# Patient Record
Sex: Male | Born: 1950 | Race: White | Hispanic: No | Marital: Married | Smoking: Never smoker
Health system: Southern US, Community
[De-identification: ages and names within clinical notes are randomized; demographics above are authoritative.]

## PROBLEM LIST (undated history)

## (undated) DIAGNOSIS — I499 Cardiac arrhythmia, unspecified: Secondary | ICD-10-CM

## (undated) DIAGNOSIS — C801 Malignant (primary) neoplasm, unspecified: Secondary | ICD-10-CM

## (undated) DIAGNOSIS — E7800 Pure hypercholesterolemia, unspecified: Secondary | ICD-10-CM

## (undated) HISTORY — PX: JOINT REPLACEMENT: SHX530

## (undated) HISTORY — PX: TOTAL KNEE ARTHROPLASTY: SHX125

## (undated) HISTORY — PX: HERNIA REPAIR: SHX51

---

## 2007-06-30 ENCOUNTER — Emergency Department (HOSPITAL_COMMUNITY): Admission: EM | Admit: 2007-06-30 | Discharge: 2007-06-30 | Payer: Self-pay | Admitting: Emergency Medicine

## 2009-10-27 ENCOUNTER — Encounter: Payer: Self-pay | Admitting: Internal Medicine

## 2009-11-17 ENCOUNTER — Ambulatory Visit: Payer: Self-pay | Admitting: Internal Medicine

## 2009-11-17 ENCOUNTER — Ambulatory Visit (HOSPITAL_COMMUNITY): Admission: RE | Admit: 2009-11-17 | Discharge: 2009-11-17 | Payer: Self-pay | Admitting: Internal Medicine

## 2010-08-18 NOTE — Letter (Signed)
Summary: Internal Other Domingo Dimes  Internal Other Domingo Dimes   Imported By: Cloria Spring LPN 95/63/8756 43:32:95  _____________________________________________________________________  External Attachment:    Type:   Image     Comment:   External Document

## 2010-12-01 NOTE — Consult Note (Signed)
Evan Ayala, Evan Ayala                ACCOUNT NO.:  [id number]   MEDICAL RECORD NO.:  [id number]          PATIENT TYPE:  EMS   LOCATION:  [facility]                         FACILITY:  [facility]   PHYSICIAN:  Karol T. Lazarus Salines, M.D. DATE OF BIRTH:  03-14-1951   DATE OF CONSULTATION:  06/30/2007  DATE OF DISCHARGE:  06/30/2007                                 CONSULTATION   CHIEF COMPLAINT:  Epistaxis.   HISTORY:  A 60 year old white male had a fairly profuse left-sided  epistaxis maybe 8 weeks ago.  He does take low-dose aspirin but no other  blood thinners.  That one happened when he leaned over to pick up  something and it took quite some time to stop.  A friend of his who has  some experience on the rescue squad told him to use some Afrin spray,  and eventually the bleeding did stop.  He continued to have some oozing  and went to the emergency room where some sort of a nasal balloon  tamponade pack was placed and then removed in the ENTs office 4-5 days  later.  At that time, he was told that the tissues were sufficiently  excoriated from the pack that he really could not identify an actual  bleeding site, and he was sent back home for further followup as needed.  Today, he had another severe episode of nose bleed, this one essentially  unprovoked and again left-sided.  No pain.  No breathing difficulty.  No  history of trauma to the nose, including surgery.  Again, he is still  taking aspirin but no other blood thinners.  No known bleeding  tendencies.  He is not bleeding inappropriately anywhere else.  He has  not been recently ill.   PAST MEDICAL HISTORY:  Not relevant.   SOCIAL HISTORY:  He is married.  He is retired from work with the [employer] in [place].   FAMILY HISTORY:  Noncontributory.   REVIEW OF SYSTEMS:  Noncontributory.   PHYSICAL EXAMINATION:  GENERAL:  This is a trim, healthy-appearing  middle-aged white male in no distress.  HEENT:  He is not actively  bleeding.  Mental status is sharp.  He hears  well in conversational speech.  Voice is clear and respirations  unlabored through the nose.  The head is atraumatic and neck supple.  Cranial nerves intact.  Both ear canals are clear with normal aerated  drums.  Anterior nose shows a mild leftward septal deviation with what  appears to be a granuloma at the chondroethmoidal junction approximately  3 cm back on the left side.  The right side shows some exposed vessels  on Kiesselbach's plexus just behind the mucocutaneous junction but  without evidence of bleeding.  No polyps or active drainage.  Oral  cavity is clear with teeth in good repair and moist membranes.  Oropharynx clear with no active bleeding.  I did not examine nasopharynx  or hypopharynx.  NECK:  Without adenopathy.   IMPRESSION:  Left anterior epistaxis, probably secondary to granuloma.   PLAN:  I discussed this with him.  With informed consent, I anesthetized  the left side of the nose using 4% cocaine solution on cotton pledgets.  After allowing several minutes for these to take effect, silver nitrate  cautery was performed of the granuloma, with some bleeding induced but  with good tolerance and good control.  Hemostasis was achieved.   I gave the patient instructions regarding nasal hygiene measures  including twice daily ointment and frequent saline spray.  I will see  him back on an as-needed basis.  He should limit his activity for a few  days but then is welcome to resume all normal activities.  He should  continue his low-dose aspirin for the appropriate medical indications.  He understands and agrees with the discussion and plans.      Gloris [place]. Lazarus Salines, M.D.  Electronically Signed     KTW/MEDQ  D:  06/30/2007  T:  07/02/2007  Job:  [id number]

## 2014-08-19 DIAGNOSIS — R001 Bradycardia, unspecified: Secondary | ICD-10-CM | POA: Insufficient documentation

## 2014-08-19 DIAGNOSIS — I10 Essential (primary) hypertension: Secondary | ICD-10-CM | POA: Insufficient documentation

## 2014-08-19 DIAGNOSIS — E785 Hyperlipidemia, unspecified: Secondary | ICD-10-CM | POA: Insufficient documentation

## 2014-09-18 ENCOUNTER — Ambulatory Visit (HOSPITAL_COMMUNITY): Payer: Managed Care, Other (non HMO) | Attending: Orthopaedic Surgery | Admitting: Physical Therapy

## 2014-09-18 DIAGNOSIS — M25562 Pain in left knee: Secondary | ICD-10-CM

## 2014-09-18 DIAGNOSIS — R29898 Other symptoms and signs involving the musculoskeletal system: Secondary | ICD-10-CM

## 2014-09-18 DIAGNOSIS — M25662 Stiffness of left knee, not elsewhere classified: Secondary | ICD-10-CM | POA: Insufficient documentation

## 2014-09-18 DIAGNOSIS — Z471 Aftercare following joint replacement surgery: Secondary | ICD-10-CM | POA: Insufficient documentation

## 2014-09-18 DIAGNOSIS — Z96652 Presence of left artificial knee joint: Secondary | ICD-10-CM | POA: Diagnosis not present

## 2014-09-18 DIAGNOSIS — R262 Difficulty in walking, not elsewhere classified: Secondary | ICD-10-CM | POA: Insufficient documentation

## 2014-09-18 DIAGNOSIS — M6281 Muscle weakness (generalized): Secondary | ICD-10-CM | POA: Diagnosis not present

## 2014-09-18 NOTE — Therapy (Signed)
[place], [place], [address] Phone: [phone]   Fax:  [phone]  Physical Therapy Evaluation  Patient Details  Name: Evan Ayala MRN: [id number] Date of Birth: 11/19/1950 Referring Provider:  Myra Rude, MD  Encounter Date: 09/18/2014      PT End of Session - 09/18/14 1046    Visit Number 1   Number of Visits 16   Date for PT Re-Evaluation 10/18/14   Authorization Type [employer]ign[employer]   Authorization - Visit Number 1   Authorization - Number of Visits 16   PT Start Time 1017   PT Stop Time 1100   PT Time Calculation (min) 43 min   Activity Tolerance Patient tolerated treatment well   Behavior During Therapy [facility] for tasks assessed/performed      No past medical history on file.  No past surgical history on file.  There were no vitals taken for this visit.  Visit Diagnosis:  Status post total left knee replacement  Difficulty walking  Left knee pain  Knee stiffness, left  Weakness of left lower extremity      Subjective Assessment - 09/18/14 1023    Symptoms Notes cramoping in Lt calf.    Pertinent History Lt knee TKA, patient had home health physical therapy 6x. no longer taking pain medications.    How long can you walk comfortably? 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 Yes   Pain Score 7   at worst, typically 1-2   Pain Location Knee   Pain Orientation Left   Pain Descriptors / Indicators Aching   Pain Type Surgical pain   Pain Onset 1 to 4 weeks ago   Pain Frequency Intermittent   Aggravating Factors  too much activity.    Pain Relieving Factors ice.           [facility] PT Assessment - 09/18/14 0001    Assessment   Medical Diagnosis Lt TKA resulting ion diffiulty walking.    Onset Date 08/27/14   Next MD Visit Rhett Hallows, 10/23/14   Prior Therapy 6x with HHPT.    Balance Screen   Has  the patient fallen in the past 6 months No   Has the patient had a decrease in activity level because of a fear of falling?  No   Is the patient reluctant to leave their home because of a fear of falling?  No   Prior Function   Level of Independence Independent with basic ADLs   Cognition   Overall Cognitive Status Within Functional Limits for tasks assessed   Functional Tests   Functional tests Other;Other2   Other:   Other/ Comments Gait: limited hipp internal and External rotation, limited stride length secodnary to limited knee extension.    ROM / Strength   AROM / PROM / Strength AROM;Strength   AROM   AROM Assessment Site Hip;Knee;Ankle   Right/Left Hip Right;Left   Right/Left Knee Left;Right   Right Knee Extension 0   Right Knee Flexion 121   Left Knee Extension -6   Left Knee Flexion 92   Right/Left Ankle Right;Left   Strength   Strength Assessment Site Hip;Knee;Ankle   Right/Left Hip Right;Left   Right Hip Flexion 5/5   Left Hip Flexion 4/5   Right/Left Knee Right;Left   Right Knee Flexion 5/5   Right Knee Extension 5/5   Left Knee Flexion 2+/5  Left Knee Extension 2+/5   Right/Left Ankle Right;Left   Right Ankle Dorsiflexion 5/5   Left Ankle Dorsiflexion 4+/5            PT Education - 09/18/14 1043    Education provided Yes   Education Details LE stretches: Calf, hamstring, hip flexor groin stretch 3 ways each with 20 ssecond holds for HEP.   Person(s) Educated Patient   Methods Explanation;Demonstration;Handout   Comprehension Verbalized understanding;Returned demonstration          PT Short Term Goals - 09/18/14 1903    PT SHORT TERM GOAL #1   Title Patien twiill be independent with HEP   Time 4   Period Weeks   Status New   PT SHORT TERM GOAL #2   Title Patint will be able to dmeosntrate full Lt knee extenion to 0 degrees   Baseline -6 degrees   Time 4   Period Weeks   Status New   PT SHORT TERM GOAL #3   Title Patient will be able to  dmeosntrate Lt knee flexion of 110 degrees to be able to squat to a low chair   Baseline 92 degrees   Time 4   Period Weeks   Status New           PT Long Term Goals - 09/18/14 1905    PT LONG TERM GOAL #1   Title Patient will state 0 pain with ambualtion >29minutes   Time 8   Period Weeks   Status New   PT LONG TERM GOAL #2   Title Patient will be bale to flex Lt knee 120 degrees to be able to croutch to work in garden.    Time 8   Period Weeks   Status New   PT LONG TERM GOAL #3   Title Patient will demonstrate 5/5 MMT strength for quadriceps and hamstrings to be able to go up and down stairs without UE support.    Time 8   Period Weeks   Status New               Plan - 09/18/14 1047    Clinical Impression Statement Patient displasy Lt knee stiffness and pain resulting in difficulty walking s/p Lt TKA. 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    Pt will benefit from skilled therapeutic intervention in order to improve on the following deficits Abnormal gait;Decreased endurance;Increased muscle spasms;Improper body mechanics;Impaired flexibility;Decreased strength;Difficulty walking;Pain;Decreased range of motion   Rehab Potential Good   PT Frequency 2x / week   PT Duration 8 weeks   PT Treatment/Interventions Gait training;Stair training;Patient/family education;Manual techniques;Therapeutic exercise;Balance training   PT Next Visit Plan Introduce pirifomris stretch and 3D hip excursions. increase box height for stretches to 14inches. intial focus on increasing knee extension.    PT Home Exercise Plan LE stretches hand out. given   Consulted and Agree with Plan of Care Patient         Problem List There are no active problems to display for this patient.   Leia Alf 09/18/2014, 7:06 PM  [place], [place], [address] Phone: [phone]   Fax:  [phone]

## 2014-09-18 NOTE — Addendum Note (Signed)
Addended by: Devona Konig R on: 09/18/2014 07:08 PM   Modules accepted: Orders

## 2014-09-20 ENCOUNTER — Ambulatory Visit (HOSPITAL_COMMUNITY): Payer: Managed Care, Other (non HMO)

## 2014-09-20 ENCOUNTER — Telehealth (HOSPITAL_COMMUNITY): Payer: Self-pay

## 2014-09-20 DIAGNOSIS — Z471 Aftercare following joint replacement surgery: Secondary | ICD-10-CM | POA: Diagnosis not present

## 2014-09-20 DIAGNOSIS — R262 Difficulty in walking, not elsewhere classified: Secondary | ICD-10-CM

## 2014-09-20 DIAGNOSIS — R29898 Other symptoms and signs involving the musculoskeletal system: Secondary | ICD-10-CM

## 2014-09-20 DIAGNOSIS — M25662 Stiffness of left knee, not elsewhere classified: Secondary | ICD-10-CM

## 2014-09-20 DIAGNOSIS — M25562 Pain in left knee: Secondary | ICD-10-CM

## 2014-09-20 DIAGNOSIS — Z96652 Presence of left artificial knee joint: Secondary | ICD-10-CM

## 2014-09-20 NOTE — Therapy (Signed)
[place], [place], [address] Phone: [phone]   Fax:  [phone]  Physical Therapy Treatment  Patient Details  Name: Evan Ayala MRN: [id number] Date of Birth: 12/07/1950 Referring Provider:  Myra Rude, MD  Encounter Date: 09/20/2014      PT End of Session - 09/20/14 1659    Visit Number 2   Number of Visits 16   Date for PT Re-Evaluation 10/18/14   Authorization Type [employer]ign[employer]   Authorization - Visit Number 2   Authorization - Number of Visits 16   PT Start Time 6962   PT Stop Time 1738   PT Time Calculation (min) 56 min   Activity Tolerance Patient tolerated treatment well   Behavior During Therapy [facility] for tasks assessed/performed      No past medical history on file.  No past surgical history on file.  There were no vitals taken for this visit.  Visit Diagnosis:  Status post total left knee replacement  Difficulty walking  Left knee pain  Knee stiffness, left  Weakness of left lower extremity      Subjective Assessment - 09/20/14 1642    Symptoms Pt reports minimal pain scale today 1/10 on medial aspect of knee.  Reports walking 1/2 every day and compliant with HEP daily without question.   Currently in Pain? Yes   Pain Score 1    Pain Location Knee   Pain Orientation Left;Medial   Pain Descriptors / Indicators Sharp   Pain Type Surgical pain          [facility] PT Assessment - 09/20/14 0001    Assessment   Medical Diagnosis Lt TKA resulting ion diffiulty walking.    Onset Date 08/27/14   Next MD Visit Rhett Hallows, 10/18/14   Prior Therapy 6x with HHPT.                   [facility] Adult PT Treatment/Exercise - 09/20/14 0001    Exercises   Exercises Knee/Hip   Knee/Hip Exercises: Stretches   Active Hamstring Stretch 3 reps;30 seconds   Active Hamstring Stretch Limitations 14 in step 3 directions   Quad Stretch 3 reps;30 seconds   Quad Stretch Limitations prone with  rope   Hip Flexor Stretch 3 reps;30 seconds   Hip Flexor Stretch Limitations 14in box   Piriformis Stretch 3 reps;30 seconds   Piriformis Stretch Limitations supine figure 4 with towel   Gastroc Stretch 3 reps;30 seconds   Gastroc Stretch Limitations slant board   Knee/Hip Exercises: Aerobic   Stationary Bike 8' seat 14   Knee/Hip Exercises: Standing   Functional Squat 10 reps;2 sets   Functional Squat Limitations neutral for frontal and transverse planes; split stance Lt LE behind sagital plane with    Gait Training Gait training with focus on heel to toe pattern, equal stance phase and stride length 226 feet   Knee/Hip Exercises: Supine   Quad Sets AAROM;10 reps   Short Arc [employer] Left;15 reps   Short Arc Quad Sets Limitations 5" holds   Heel Slides 10 reps   Terminal Knee Extension AAROM;Left;10 reps   Straight Leg Raises 10 reps   Knee Extension PROM;Left;3 sets                PT Education - 09/20/14 1743    Education provided Yes   Education Details Reviewed HEP, explained benefits of ice and appropriate time for iceing, gait mechanics  Person(s) Educated Patient   Methods Explanation;Demonstration   Comprehension Verbalized understanding;Returned demonstration          PT Short Term Goals - 09/20/14 1700    PT SHORT TERM GOAL #1   Title Patien twiill be independent with HEP   Status On-going   PT SHORT TERM GOAL #2   Title Patint will be able to dmeosntrate full Lt knee extenion to 0 degrees   Status On-going   PT SHORT TERM GOAL #3   Title Patient will be able to dmeosntrate Lt knee flexion of 110 degrees to be able to squat to a low chair   Status On-going           PT Long Term Goals - 09/20/14 1700    PT LONG TERM GOAL #1   Title Patient will state 0 pain with ambualtion >88minutes   PT LONG TERM GOAL #2   Title Patient will be bale to flex Lt knee 120 degrees to be able to croutch to work in garden.    PT LONG TERM GOAL #3   Title  Patient will demonstrate 5/5 MMT strength for quadriceps and hamstrings to be able to go up and down stairs without UE support.                Plan - 09/20/14 1726    Clinical Impression Statement Reviewed HEP stretches for correct form to assure correct techniques at home with min cueing for foot placement with hamstring stretches.  Session focus on impproving AROM primarly extension.  Neruomusculature re-ed to improve quad contraction to assist with knee extension with tactile and verbal cueing to relax glut activaiton  Good activation with SAQ.  PROM complete to improve ROM per pt. tolerance.  AROM lacking 6-95 degrees this session.  Pt stated he has recumbent bike at home, able to complete full revolution seat 14.  Pain sacle 1/10 at end of sessoin.  Pt encouraged to apply ice for 10-20 minutes at home for pain control.     PT Next Visit Plan Continue with current PT POC including stretches, improve AROM wtih focus on knee extension, gait training and 3D hip excursion with focus on equal stance phase.        Problem List There are no active problems to display for this patient.  Ihor Austin, [place]  Aldona Lento 09/20/2014, 5:47 PM  [place], [place], [address] Phone: [phone]   Fax:  [phone]

## 2014-09-26 ENCOUNTER — Ambulatory Visit (HOSPITAL_COMMUNITY): Payer: Managed Care, Other (non HMO) | Admitting: Physical Therapy

## 2014-09-26 DIAGNOSIS — Z471 Aftercare following joint replacement surgery: Secondary | ICD-10-CM | POA: Diagnosis not present

## 2014-09-26 DIAGNOSIS — M25662 Stiffness of left knee, not elsewhere classified: Secondary | ICD-10-CM

## 2014-09-26 DIAGNOSIS — R29898 Other symptoms and signs involving the musculoskeletal system: Secondary | ICD-10-CM

## 2014-09-26 DIAGNOSIS — R262 Difficulty in walking, not elsewhere classified: Secondary | ICD-10-CM

## 2014-09-26 DIAGNOSIS — Z96652 Presence of left artificial knee joint: Secondary | ICD-10-CM

## 2014-09-26 DIAGNOSIS — M25562 Pain in left knee: Secondary | ICD-10-CM

## 2014-09-26 NOTE — Therapy (Signed)
[place], [place], [address] Phone: [phone]   Fax:  [phone]  Physical Therapy Treatment  Patient Details  Name: Evan Ayala MRN: [id number] Date of Birth: 1951/06/01 Referring Provider:  Myra Rude, MD  Encounter Date: 09/26/2014      PT End of Session - 09/26/14 1807    Visit Number 3   Number of Visits 16   Date for PT Re-Evaluation 10/18/14   Authorization Type Cigna   Authorization - Visit Number 3   Authorization - Number of Visits 16   PT Start Time 0071   PT Stop Time 1817   PT Time Calculation (min) 41 min   Activity Tolerance Patient tolerated treatment well   Behavior During Therapy [facility] for tasks assessed/performed      No past medical history on file.  No past surgical history on file.  There were no vitals filed for this visit.  Visit Diagnosis:  Status post total left knee replacement  Difficulty walking  Left knee pain  Knee stiffness, left  Weakness of left lower extremity      Subjective Assessment - 09/26/14 1735    Symptoms Patient is able to go up and down stairs with reciprocal pattern and notes improved pain except when sleeping.    Currently in Pain? Yes   Pain Score 1    Pain Location Knee   Pain Orientation Left;Medial   Pain Descriptors / Indicators Sharp   Pain Type Surgical pain            [facility] Adult PT Treatment/Exercise - 09/26/14 0001    Knee/Hip Exercises: Stretches   Active Hamstring Stretch 2 reps;20 seconds   Active Hamstring Stretch Limitations 14 in step 3 directions   Quad Stretch 3 reps;30 seconds   Quad Stretch Limitations prone with rope   Hip Flexor Stretch 3 reps;30 seconds   Hip Flexor Stretch Limitations 14in box   ITB Stretch Limitations 10x 3" to 4" box   Piriformis Stretch 3 reps;30 seconds   Piriformis Stretch Limitations supine figure 4 with towel   Gastroc Stretch 3 reps;20 seconds   Gastroc Stretch Limitations  slant board   Knee/Hip Exercises: Aerobic   Stationary Bike 8' seat 14   Knee/Hip Exercises: Standing   Forward Lunges Limitations 3 way knee drives on 12" box 21F    Functional Squat Limitations 3D hip excursions 10x   Gait Training walking hip excursions              PT Short Term Goals - 09/20/14 1700    PT SHORT TERM GOAL #1   Title Patien twiill be independent with HEP   Status On-going   PT SHORT TERM GOAL #2   Title Patint will be able to dmeosntrate full Lt knee extenion to 0 degrees   Status On-going   PT SHORT TERM GOAL #3   Title Patient will be able to dmeosntrate Lt knee flexion of 110 degrees to be able to squat to a low chair   Status On-going           PT Long Term Goals - 09/20/14 1700    PT LONG TERM GOAL #1   Title Patient will state 0 pain with ambualtion >92minutes   PT LONG TERM GOAL #2   Title Patient will be bale to flex Lt knee 120 degrees to be able to croutch to work in garden.    PT LONG TERM GOAL #3  Title Patient will demonstrate 5/5 MMT strength for quadriceps and hamstrings to be able to go up and down stairs without UE support.            Plan - 09/26/14 1808    Clinical Impression Statement Stretches reviewed with good performance. Patient demonstrates improving knee AROM with improving gait. Functional manual reactions performed during calf and hip flexor stretches to increase knee extension with knee nearly obtaining full extension. patient demonstrated improving gait with more functional knee extension at end of session.    PT Next Visit Plan Continue with current POC including stretches, improve AROM wtih focus on knee extension, gait training and 3D hip excursion with focus on equal stance phase.        Problem List There are no active problems to display for this patient.  Devona Konig PT DPT [place] [address] [place], [place], [address] Phone:  [phone]   Fax:  [phone]

## 2014-09-27 ENCOUNTER — Ambulatory Visit (HOSPITAL_COMMUNITY): Payer: Managed Care, Other (non HMO)

## 2014-09-27 DIAGNOSIS — Z471 Aftercare following joint replacement surgery: Secondary | ICD-10-CM | POA: Diagnosis not present

## 2014-09-27 DIAGNOSIS — M25562 Pain in left knee: Secondary | ICD-10-CM

## 2014-09-27 DIAGNOSIS — R29898 Other symptoms and signs involving the musculoskeletal system: Secondary | ICD-10-CM

## 2014-09-27 DIAGNOSIS — Z96652 Presence of left artificial knee joint: Secondary | ICD-10-CM

## 2014-09-27 DIAGNOSIS — R262 Difficulty in walking, not elsewhere classified: Secondary | ICD-10-CM

## 2014-09-27 DIAGNOSIS — M25662 Stiffness of left knee, not elsewhere classified: Secondary | ICD-10-CM

## 2014-09-27 NOTE — Therapy (Addendum)
[place], [place], [address] Phone: [phone]   Fax:  [phone]  Physical Therapy Treatment  Patient Details  Name: Evan Ayala MRN: [id number] Date of Birth: 11-Jul-1951 Referring Provider:  Myra Rude, MD  Encounter Date: 09/27/2014      PT End of Session - 09/27/14 1651    Visit Number 4   Number of Visits 16   Date for PT Re-Evaluation 10/18/14   Authorization Type Cigna   Authorization - Visit Number 4   Authorization - Number of Visits 16   PT Start Time 2423   PT Stop Time 1834   PT Time Calculation (min) 49 min   Activity Tolerance Patient tolerated treatment well   Behavior During Therapy [facility] for tasks assessed/performed      No past medical history on file.  No past surgical history on file.  There were no vitals filed for this visit.  Visit Diagnosis:  Status post total left knee replacement  Difficulty walking  Left knee pain  Knee stiffness, left  Weakness of left lower extremity      Subjective Assessment - 09/27/14 1646    Symptoms Pt stated he was sore followng last session, pain minimal today 1/10   Currently in Pain? Yes   Pain Score 1    Pain Location Knee   Pain Orientation Left;Medial   Pain Descriptors / Indicators [facility] PT Assessment - 09/27/14 0001    Assessment   Medical Diagnosis Lt TKA resulting ion diffiulty walking.    Onset Date 08/27/14   Next MD Visit Rhett Hallows, 10/18/14   Prior Therapy 6x with HHPT.                    [facility] Adult PT Treatment/Exercise - 09/27/14 1722    Exercises   Exercises Knee/Hip   Knee/Hip Exercises: Stretches   Active Hamstring Stretch 3 reps;30 seconds   Active Hamstring Stretch Limitations 14 in step 3 directions   Quad Stretch 3 reps;30 seconds   Quad Stretch Limitations prone with rope   Knee: Self-Stretch Limitations knee drives on 53IR step   Piriformis Stretch 3 reps;30  seconds   Piriformis Stretch Limitations seated   Gastroc Stretch 3 reps;20 seconds   Gastroc Stretch Limitations slant board   Knee/Hip Exercises: Aerobic   Stationary Bike 8' seat 12   Knee/Hip Exercises: Standing   Functional Squat Limitations 3D hip excursions 10x split stance   Gait Training walking hip excursions    Knee/Hip Exercises: Supine   Short Arc Quad Sets Left;15 reps   Heel Slides 10 reps   Heel Slides Limitations 5-112                  PT Short Term Goals - 09/27/14 1715    PT SHORT TERM GOAL #1   Title Patien twiill be independent with HEP   Baseline Daily   Status Achieved   PT SHORT TERM GOAL #2   Title Patint will be able to dmeosntrate full Lt knee extenion to 0 degrees   Baseline 09/27/2014 lacking 5 degress   PT SHORT TERM GOAL #3   Title Patient will be able to dmeosntrate Lt knee flexion of 110 degrees to be able to squat to a low chair   Baseline 09/27/2014 112 degrees   Status Partially Met  PT Long Term Goals - 09/27/14 1717    PT LONG TERM GOAL #1   Title Patient will state 0 pain with ambualtion >80mnutes   PT LONG TERM GOAL #2   Title Patient will be able to flex Lt knee 120 degrees to be able to croutch to work in garden.    PT LONG TERM GOAL #3   Title Patient will demonstrate 5/5 MMT strength for quadriceps and hamstrings to be able to go up and down stairs without UE support.                Plan - 09/27/14 1847    Clinical Impression Statement ROM making vast improvements, AROM 5-112 this session.  3D hip excursion complete in split stance this session to improve weight bearing for equalized stance phase.  Hip mobiltiy is improving, pt able to complete piriformis stretch in seated position this session.  No reports of pain through session.   PT Next Visit Plan Continue with current POC including stretches, improve AROM wtih focus on knee extension, gait training and 3D hip excursion with focus on equal stance  phase.        Problem List There are no active problems to display for this patient.  CIhor Austin [place] CAldona Lento3/05/2015, 6:52 PM  [id number] [address]Phone: [phone]  Fax:  [phone]

## 2014-10-01 ENCOUNTER — Ambulatory Visit (HOSPITAL_COMMUNITY): Payer: Managed Care, Other (non HMO)

## 2014-10-01 DIAGNOSIS — Z471 Aftercare following joint replacement surgery: Secondary | ICD-10-CM | POA: Diagnosis not present

## 2014-10-01 DIAGNOSIS — Z96652 Presence of left artificial knee joint: Secondary | ICD-10-CM

## 2014-10-01 DIAGNOSIS — M25562 Pain in left knee: Secondary | ICD-10-CM

## 2014-10-01 DIAGNOSIS — R262 Difficulty in walking, not elsewhere classified: Secondary | ICD-10-CM

## 2014-10-01 DIAGNOSIS — M25662 Stiffness of left knee, not elsewhere classified: Secondary | ICD-10-CM

## 2014-10-01 DIAGNOSIS — R29898 Other symptoms and signs involving the musculoskeletal system: Secondary | ICD-10-CM

## 2014-10-01 NOTE — Therapy (Signed)
[place], [place], [address] Phone: [phone]   Fax:  [phone]  Physical Therapy Treatment  Patient Details  Name: Evan Ayala MRN: [id number] Date of Birth: 1950-12-05 Referring Provider:  Myra Rude, MD  Encounter Date: 10/01/2014      PT End of Session - 10/01/14 1719    Visit Number 5   Number of Visits 16   Date for PT Re-Evaluation 10/18/14   Authorization Type [employer]   Authorization - Visit Number 5   Authorization - Number of Visits 16   PT Start Time 6962   PT Stop Time 1733   PT Time Calculation (min) 45 min   Activity Tolerance Patient tolerated treatment well   Behavior During Therapy [facility] for tasks assessed/performed      No past medical history on file.  No past surgical history on file.  There were no vitals filed for this visit.  Visit Diagnosis:  Status post total left knee replacement  Difficulty walking  Left knee pain  Knee stiffness, left  Weakness of left lower extremity      Subjective Assessment - 10/01/14 1706    Symptoms Pt stated he did too much at [facility] this weekend, pain scale 1-2/10   Currently in Pain? Yes   Pain Score 2    Pain Location Knee   Pain Orientation Left;Medial;Lateral   Pain Descriptors / Indicators Dull           [facility] Adult PT Treatment/Exercise - 10/01/14 1717    Exercises   Exercises Knee/Hip   Knee/Hip Exercises: Stretches   Active Hamstring Stretch 3 reps;30 seconds   Active Hamstring Stretch Limitations 14 in step 3 directions   Quad Stretch 3 reps;30 seconds   Quad Stretch Limitations prone with rope   Knee: Self-Stretch Limitations knee drives on 6in step   Piriformis Stretch 3 reps;30 seconds   Piriformis Stretch Limitations seated   Gastroc Stretch 3 reps;30 seconds   Gastroc Stretch Limitations slant board   Knee/Hip Exercises: Aerobic   Stationary Bike 8' seat 12-- 10   Knee/Hip Exercises: Standing    Functional Squat Limitations 3D hip excursions 10x split stance   Rocker Board 2 minutes   Rocker Board Limitations R/L   Gait Training walking hip excursions    Knee/Hip Exercises: Supine   Short Arc Quad Sets Left;15 reps   Heel Slides Limitations 4-113   Terminal Knee Extension AROM;Left;10 reps   Terminal Knee Extension Limitations Therapist facilitation for proper mm activatin   Knee/Hip Exercises: Prone   Other Prone Exercises TKE 10 x5"             PT Short Term Goals - 10/01/14 1721    PT SHORT TERM GOAL #1   Title Patien twiill be independent with HEP   Status Achieved   PT SHORT TERM GOAL #2   Title Patint will be able to dmeosntrate full Lt knee extenion to 0 degrees   Status On-going   PT SHORT TERM GOAL #3   Title Patient will be able to dmeosntrate Lt knee flexion of 110 degrees to be able to squat to a low chair           PT Long Term Goals - 10/01/14 1721    PT LONG TERM GOAL #1   Title Patient will state 0 pain with ambualtion >71minutes   PT LONG TERM GOAL #2   Title Patient will be able to  flex Lt knee 120 degrees to be able to croutch to work in garden.    PT LONG TERM GOAL #3   Title Patient will demonstrate 5/5 MMT strength for quadriceps and hamstrings to be able to go up and down stairs without UE support.                Plan - 10/01/14 1721    Clinical Impression Statement AROM is progressing with improved extension and flexion this session, AROM 4-113 degrees.  Added TKE to POC to improve quad activation for knee extension.  Reviewed proper gait mechanics with emphasis on knee extension with heel touch.with limping resolved following cues.  Pt reported pain reduced at end of session.   PT Next Visit Plan Continue with current POC including stretches, improve AROM wtih focus on knee extension, gait training and 3D hip excursion with focus on equal stance phase.        Problem List There are no active problems to display for this  patient.  Ihor Austin, [place]  Aldona Lento 10/01/2014, 5:33 PM  [place] [address] [place], [place], [address] Phone: [phone]   Fax:  [phone]

## 2014-10-03 ENCOUNTER — Ambulatory Visit (HOSPITAL_COMMUNITY): Payer: Managed Care, Other (non HMO)

## 2014-10-03 DIAGNOSIS — R29898 Other symptoms and signs involving the musculoskeletal system: Secondary | ICD-10-CM

## 2014-10-03 DIAGNOSIS — R262 Difficulty in walking, not elsewhere classified: Secondary | ICD-10-CM

## 2014-10-03 DIAGNOSIS — Z96652 Presence of left artificial knee joint: Secondary | ICD-10-CM

## 2014-10-03 DIAGNOSIS — M25662 Stiffness of left knee, not elsewhere classified: Secondary | ICD-10-CM

## 2014-10-03 DIAGNOSIS — M25562 Pain in left knee: Secondary | ICD-10-CM

## 2014-10-03 DIAGNOSIS — Z471 Aftercare following joint replacement surgery: Secondary | ICD-10-CM | POA: Diagnosis not present

## 2014-10-03 NOTE — Therapy (Signed)
[place], [place], [address] Phone: [phone]   Fax:  [phone]  Physical Therapy Treatment  Patient Details  Name: Evan Ayala MRN: [id number] Date of Birth: Jun 05, 1951 Referring Provider:  Myra Rude, MD  Encounter Date: 10/03/2014      PT End of Session - 10/03/14 0807    Visit Number 6   Number of Visits 16   Date for PT Re-Evaluation 10/18/14   Authorization Type [employer]   Authorization - Visit Number 6   Authorization - Number of Visits 10   PT Start Time 0800   PT Stop Time 0846   PT Time Calculation (min) 46 min   Activity Tolerance Patient tolerated treatment well   Behavior During Therapy [facility] for tasks assessed/performed      No past medical history on file.  No past surgical history on file.  There were no vitals filed for this visit.  Visit Diagnosis:  Status post total left knee replacement  Difficulty walking  Left knee pain  Knee stiffness, left  Weakness of left lower extremity      Subjective Assessment - 10/03/14 0803    Symptoms Pt stated Lt knee is sore with minimal pain scale 1/10 on medial portion.  Pt stated he ran out of anti-inflammatory meds last week, took aleve this morining prior therapy.   Currently in Pain? Yes   Pain Score 1    Pain Location Knee   Pain Orientation Left;Medial   Pain Descriptors / Indicators Aching            [facility] PT Assessment - 10/03/14 0001    Assessment   Medical Diagnosis Lt TKA resulting ion diffiulty walking.    Onset Date 08/27/14   Next MD Visit Rhett Hallows, 10/18/14   Prior Therapy 6x with HHPT.    AROM   Left Knee Extension -4   Left Knee Flexion 113                   [facility] Adult PT Treatment/Exercise - 10/03/14 0811    Exercises   Exercises Knee/Hip   Knee/Hip Exercises: Stretches   Active Hamstring Stretch 3 reps;30 seconds   Active Hamstring Stretch Limitations 14 in step 3 directions   Quad Stretch 3 reps;30 seconds   Quad Stretch Limitations prone with rope   Gastroc Stretch 3 reps;30 seconds   Gastroc Stretch Limitations slant board   Knee/Hip Exercises: Aerobic   Stationary Bike 8' seat 11-- 10 for ROM   Knee/Hip Exercises: Standing   Terminal Knee Extension Left;10 reps;Theraband   Theraband Level (Terminal Knee Extension) Level 3 (Green)   Lateral Step Up Left;15 reps;Hand Hold: 1;Step Height: 4"   Functional Squat Limitations 3D hip excursions 10x split stance   Rocker Board 2 minutes   Rocker Board Limitations R/L   SLS L 33", Rt 9" max of 3   Gait Training walking hip excursions    Other Standing Knee Exercises 10 STS no HHA from 22in chair   Knee/Hip Exercises: Supine   Short Arc Quad Sets Left;15 reps   Heel Slides Limitations 4-113   Terminal Knee Extension AROM;Left;15 reps   Knee/Hip Exercises: Prone   Other Prone Exercises TKE 10 x5"                  PT Short Term Goals - 10/03/14 0807    PT SHORT TERM GOAL #1   Title Patien twiill be independent  with HEP   Status Achieved   PT SHORT TERM GOAL #2   Title Patint will be able to dmeosntrate full Lt knee extenion to 0 degrees   Baseline 10/03/2014 lacking 4 degress   Status On-going   PT SHORT TERM GOAL #3   Title Patient will be able to dmeosntrate Lt knee flexion of 110 degrees to be able to squat to a low chair   Baseline 10/03/2014 113 degrees with ability to complete STS no HHA from 22in chair   Status Partially Met           PT Long Term Goals - 10/03/14 7048    PT LONG TERM GOAL #1   Title Patient will state 0 pain with ambualtion >68mnutes   PT LONG TERM GOAL #2   Title Patient will be able to flex Lt knee 120 degrees to be able to croutch to work in garden.    PT LONG TERM GOAL #3   Title Patient will demonstrate 5/5 MMT strength for quadriceps and hamstrings to be able to go up and down stairs without UE support.    Status On-going               Plan -  10/03/14 08891   Clinical Impression Statement Noted increased edema this session limiing AROM especially extenson, pt stated he ran out of anti-inflammation meds last week, pt encoiuraged to call MD about possible refill.  Added stanidng TKE to improve quad strengthening for knee extension as well as lateral step up for functioanl strengtheing and SLS for stabilty.  Pt able to complete 10 STS from 22 in chair with split stance with no HHA.  No reports of increased pain through session    PT Next Visit Plan Continue with current POC including stretches, improve AROM wtih focus on knee extension, gait training and 3D hip excursion with focus on equal stance phase.        Problem List There are no active problems to display for this patient. CIhor Austin LStow  CAldona Lento3/17/2016, 8:57 AM  [place] [address]Phone: [phone]  Fax:  [phone]

## 2014-10-08 ENCOUNTER — Ambulatory Visit (HOSPITAL_COMMUNITY): Payer: Managed Care, Other (non HMO) | Admitting: Physical Therapy

## 2014-10-08 DIAGNOSIS — M25662 Stiffness of left knee, not elsewhere classified: Secondary | ICD-10-CM

## 2014-10-08 DIAGNOSIS — Z96652 Presence of left artificial knee joint: Secondary | ICD-10-CM

## 2014-10-08 DIAGNOSIS — R262 Difficulty in walking, not elsewhere classified: Secondary | ICD-10-CM

## 2014-10-08 DIAGNOSIS — M25562 Pain in left knee: Secondary | ICD-10-CM

## 2014-10-08 DIAGNOSIS — Z471 Aftercare following joint replacement surgery: Secondary | ICD-10-CM | POA: Diagnosis not present

## 2014-10-08 DIAGNOSIS — R29898 Other symptoms and signs involving the musculoskeletal system: Secondary | ICD-10-CM

## 2014-10-08 NOTE — Therapy (Addendum)
[place], [place], [address] Phone: [phone]   Fax:  [phone]  Physical Therapy Treatment  Patient Details  Name: Evan Ayala MRN: [id number] Date of Birth: [birth year] Referring Provider:  Myra Rude, MD  Encounter Date: 10/08/2014      PT End of Session - 10/08/14 0816    Visit Number 7   Number of Visits 16   Date for PT Re-Evaluation 10/18/14   Authorization Type [employer]   Authorization - Visit Number 7   Authorization - Number of Visits 10   PT Start Time 0801   PT Stop Time 0845   PT Time Calculation (min) 44 min   Activity Tolerance Patient tolerated treatment well   Behavior During Therapy [facility] for tasks assessed/performed      No past medical history on file.  No past surgical history on file.  There were no vitals filed for this visit.  Visit Diagnosis:  Status post total left knee replacement  Difficulty walking  Left knee pain  Knee stiffness, left  Weakness of left lower extremity      Subjective Assessment - 10/08/14 0802    Symptoms atient states improved symptoms since refilling pain/inflammatory medication. no pain noted today. Patient also noted the medicatin helped with restless leg syndrome aloowing him to sleep better.    Currently in Pain? No/denies            [facility] PT Assessment - 10/08/14 0001    AROM   Right Knee Flexion 124                   [facility] Adult PT Treatment/Exercise - 10/08/14 0001    Knee/Hip Exercises: Stretches   Active Hamstring Stretch 2 reps;20 seconds   Active Hamstring Stretch Limitations 14 in step 3 directions   Quad Stretch 3 reps;30 seconds   Quad Stretch Limitations prone with rope   Knee: Self-Stretch Limitations knee drives 3 way on 66QH step 10x each with 3 second hold   Gastroc Stretch 3 reps;30 seconds   Gastroc Stretch Limitations slant board   Knee/Hip Exercises: Aerobic   Stationary Bike 8' seat 11-- 10  for ROM   Knee/Hip Exercises: Standing   Heel Raises 20 reps   Heel Raises Limitations cues for straightening knees.    Terminal Knee Extension Left;10 reps;Theraband   Theraband Level (Terminal Knee Extension) Level 3 (Green)   Forward Step Up Step Height: 6"   Step Down Step Height: 2";Hand Hold: 1;10 reps   Functional Squat Limitations 3D hip excursions 10x split stance   Other Standing Knee Exercises Retro amstring walk for Lt knee extension   Other Standing Knee Exercises Sumo walk with green T-band 29f each   Manual Therapy   Manual Therapy Joint mobilization   Joint Mobilization Lt knee AP/PA joint mobilization for flexion/esxtension ROM                   PT Short Term Goals - 10/03/14 04765   PT SHORT TERM GOAL #1   Title Patien twiill be independent with HEP   Status Achieved   PT SHORT TERM GOAL #2   Title Patint will be able to dmeosntrate full Lt knee extenion to 0 degrees   Baseline 10/03/2014 lacking 4 degress   Status On-going   PT SHORT TERM GOAL #3   Title Patient will be able to dmeosntrate Lt knee flexion of 110 degrees to be able  to squat to a low chair   Baseline 10/03/2014 113 degrees with ability to complete STS no HHA from 22in chair   Status Partially Met           PT Long Term Goals - 10/03/14 8921    PT LONG TERM GOAL #1   Title Patient will state 0 pain with ambualtion >65mnutes   PT LONG TERM GOAL #2   Title Patient will be able to flex Lt knee 120 degrees to be able to croutch to work in garden.    PT LONG TERM GOAL #3   Title Patient will demonstrate 5/5 MMT strength for quadriceps and hamstrings to be able to go up and down stairs without UE support.    Status On-going               Plan - 10/08/14 01941   Clinical Impression Statement Noted decreased edema and swelling following re-initiating analgesic and anti-inflammatory medication. Introduced retro hamstring walk with noted difficulty maiontaining Lt knee straight  durign forwad flexion. Patient able to control 2" box step down with 1 UE support.  and step ups on 6" box with no UE support   PT Next Visit Plan Continue with current POC including stretches, improve AROM wtih focus on knee extension, gait training and 3D hip excursion with focus on equal stance phase.        Problem List There are no active problems to display for this patient.   DLeia Alf3/22/2016, 8:52 AM  [place] [address]Phone: [phone]  Fax:  [phone]

## 2014-10-10 ENCOUNTER — Ambulatory Visit (HOSPITAL_COMMUNITY): Payer: Managed Care, Other (non HMO)

## 2014-10-10 DIAGNOSIS — Z96652 Presence of left artificial knee joint: Secondary | ICD-10-CM

## 2014-10-10 DIAGNOSIS — M25562 Pain in left knee: Secondary | ICD-10-CM

## 2014-10-10 DIAGNOSIS — M25662 Stiffness of left knee, not elsewhere classified: Secondary | ICD-10-CM

## 2014-10-10 DIAGNOSIS — R262 Difficulty in walking, not elsewhere classified: Secondary | ICD-10-CM

## 2014-10-10 DIAGNOSIS — Z471 Aftercare following joint replacement surgery: Secondary | ICD-10-CM | POA: Diagnosis not present

## 2014-10-10 DIAGNOSIS — R29898 Other symptoms and signs involving the musculoskeletal system: Secondary | ICD-10-CM

## 2014-10-10 NOTE — Therapy (Signed)
[place], [place], [address] Phone: [phone]   Fax:  [phone]  Physical Therapy Treatment  Patient Details  Name: Evan Ayala MRN: [id number] Date of Birth: 1950-12-25 Referring Provider:  Myra Rude, MD  Encounter Date: 10/10/2014      PT End of Session - 10/10/14 0854    Visit Number 8   Number of Visits 16   Date for PT Re-Evaluation 10/18/14   Authorization Type Cigna   Authorization - Visit Number 8   Authorization - Number of Visits 10   PT Start Time 0845   PT Stop Time 0938   PT Time Calculation (min) 53 min   Activity Tolerance Patient tolerated treatment well   Behavior During Therapy [facility] for tasks assessed/performed      No past medical history on file.  No past surgical history on file.  There were no vitals filed for this visit.  Visit Diagnosis:  Status post total left knee replacement  Difficulty walking  Left knee pain  Knee stiffness, left  Weakness of left lower extremity      Subjective Assessment - 10/10/14 0848    Symptoms Pt stated he was sore following last session, no reports of pain today, just minor soreness.  Pt stated he continues to focus on knee extension at home, has 2.5# weight he does prone knee hang with.   Currently in Pain? No/denies   Pain Descriptors / Indicators Sore            [facility] PT Assessment - 10/10/14 0001    Assessment   Medical Diagnosis Lt TKA resulting ion diffiulty walking.    Onset Date 08/27/14   Next MD Visit Rhett Hallows, 10/18/14   Prior Therapy 6x with HHPT.    AROM   Left Knee Extension 2                   [facility] Adult PT Treatment/Exercise - 10/10/14 0857    Exercises   Exercises Knee/Hip   Knee/Hip Exercises: Stretches   Active Hamstring Stretch 3 reps;30 seconds   Active Hamstring Stretch Limitations 14 in step 3 directions   Quad Stretch 3 reps;30 seconds   Quad Stretch Limitations prone with  rope   Piriformis Stretch 3 reps;30 seconds   Piriformis Stretch Limitations supine figure 4   Gastroc Stretch 3 reps;30 seconds   Gastroc Stretch Limitations slant board   Knee/Hip Exercises: Aerobic   Stationary Bike 8' seat 8 for ROM   Knee/Hip Exercises: Standing   Heel Raises Limitations   Heel Raises Limitations 250 feet heel walking   Terminal Knee Extension 20 reps;Theraband   Theraband Level (Terminal Knee Extension) Level 4 (Blue)   Lateral Step Up Left;15 reps;Hand Hold: 1;Step Height: 6"   Lateral Step Up Limitations emphasis on TKE upon step up   Forward Step Up Left;15 reps;Hand Hold: 0;Step Height: 6"   Forward Step Up Limitations emphasis on TKE upon step up   Step Down Step Height: 2";Hand Hold: 1;10 reps   Step Down Limitations single leg reach   Functional Squat 15 reps   Functional Squat Limitations 3D hip excursions split stance   Other Standing Knee Exercises Sumo walk with green T-band 49f each                  PT Short Term Goals - 10/10/14 0855    PT SHORT TERM GOAL #1   Title Patien twiill  be independent with HEP   Status Achieved   PT SHORT TERM GOAL #2   Title Patint will be able to dmeosntrate full Lt knee extenion to 0 degrees   Baseline 10/10/2014 lacking 2 degrees   Status On-going   PT SHORT TERM GOAL #3   Title Patient will be able to dmeosntrate Lt knee flexion of 110 degrees to be able to squat to a low chair   Status Partially Met           PT Long Term Goals - 10/10/14 0855    PT LONG TERM GOAL #1   Title Patient will state 0 pain with ambualtion >72mnutes   Status On-going   PT LONG TERM GOAL #2   Title Patient will be able to flex Lt knee 120 degrees to be able to croutch to work in garden.    Status On-going   PT LONG TERM GOAL #3   Title Patient will demonstrate 5/5 MMT strength for quadriceps and hamstrings to be able to go up and down stairs without UE support.    Status On-going               Plan -  10/10/14 08299   Clinical Impression Statement Noted edema contknues around Lt knee restricting TKE, pt stated he was able to refill his anti-inflammatory medication.  Session focus on functional strengthening with emphasis on TKE to improve gait mechanics.  Extension improving to 2 degrees lacking today.  Noted instability with step down due to weakness.  No reports of pain through session.  Pt encouraged to continue icing to assist wtih edema.     PT Next Visit Plan Continue with current POC including stretches, improve AROM wtih focus on knee extension, gait training and 3D hip excursion with focus on equal stance phase.        Problem List There are no active problems to display for this patient.  CIhor Austin [place]King [place] CAldona Lento3/24/2016, 9:39 AM  [place] [address]Phone: [phone]  Fax:  [phone]

## 2014-10-15 ENCOUNTER — Ambulatory Visit (HOSPITAL_COMMUNITY): Payer: Managed Care, Other (non HMO)

## 2014-10-15 DIAGNOSIS — R262 Difficulty in walking, not elsewhere classified: Secondary | ICD-10-CM

## 2014-10-15 DIAGNOSIS — Z471 Aftercare following joint replacement surgery: Secondary | ICD-10-CM | POA: Diagnosis not present

## 2014-10-15 DIAGNOSIS — M25562 Pain in left knee: Secondary | ICD-10-CM

## 2014-10-15 DIAGNOSIS — Z96652 Presence of left artificial knee joint: Secondary | ICD-10-CM

## 2014-10-15 DIAGNOSIS — R29898 Other symptoms and signs involving the musculoskeletal system: Secondary | ICD-10-CM

## 2014-10-15 DIAGNOSIS — M25662 Stiffness of left knee, not elsewhere classified: Secondary | ICD-10-CM

## 2014-10-15 NOTE — Therapy (Signed)
[place], [place], [address] Phone: [phone]   Fax:  [phone]  Physical Therapy Treatment  Patient Details  Name: Evan Ayala MRN: [id number] Date of Birth: September 21, 1950 Referring Provider:  Myra Rude, MD  Encounter Date: 10/15/2014      PT End of Session - 10/15/14 0812    Visit Number 9   Number of Visits 16   Date for PT Re-Evaluation 10/18/14   Authorization Type Cign[employer]   Authorization - Visit Number 9   Authorization - Number of Visits 10   PT Start Time 0800   PT Stop Time 0845   PT Time Calculation (min) 45 min   Activity Tolerance Patient tolerated treatment well   Behavior During Therapy [facility] for tasks assessed/performed      No past medical history on file.  No past surgical history on file.  There were no vitals filed for this visit.  Visit Diagnosis:  Status post total left knee replacement  Difficulty walking  Left knee pain  Knee stiffness, left  Weakness of left lower extremity      Subjective Assessment - 10/15/14 0805    Symptoms Pt stated very minimal pain today, maybe .5/10.  Pt stated stiffness is reducing, was a little sore from all the yardwork he did over weekend.  Pt continues to focus on knee extension at home with prone knee hang and placeing weight on knee in supine position.   Currently in Pain? Yes   Pain Score 1    Pain Location Knee   Pain Orientation Left;Medial   Pain Descriptors / Indicators Sore;Tightness                       [facility] Adult PT Treatment/Exercise - 10/15/14 0822    Exercises   Exercises Knee/Hip   Knee/Hip Exercises: Stretches   Active Hamstring Stretch 3 reps;30 seconds   Active Hamstring Stretch Limitations 14 in step 3 directions   Quad Stretch 3 reps;30 seconds   Quad Stretch Limitations prone with rope   Knee: Self-Stretch Limitations knee drives 3 way on 41PF step 10x each with 3 second hold   Gastroc  Stretch 3 reps;30 seconds   Gastroc Stretch Limitations slant board   Knee/Hip Exercises: Standing   Forward Lunges Both;10 reps   Forward Lunges Limitations Common lunge matrix    Side Lunges Both;10 reps   Side Lunges Limitations Common lunge matrix    Terminal Knee Extension 20 reps;Theraband   Theraband Level (Terminal Knee Extension) Level 4 (Blue)   Lateral Step Up Left;15 reps;Hand Hold: 1;Step Height: 6"   Lateral Step Up Limitations emphasis on TKE upon step up   Forward Step Up Left;15 reps;Hand Hold: 0;Step Height: 8"   Forward Step Up Limitations emphasis on TKE upon step up   Step Down Step Height: 2";Hand Hold: 1;10 reps   Step Down Limitations single leg reach   Functional Squat 2 sets;10 reps   Other Standing Knee Exercises 3D hip excursion                  PT Short Term Goals - 10/15/14 7902    PT SHORT TERM GOAL #1   Title Patien twiill be independent with HEP   Status Achieved   PT SHORT TERM GOAL #2   Title Patint will be able to dmeosntrate full Lt knee extenion to 0 degrees   Status On-going   PT SHORT  TERM GOAL #3   Title Patient will be able to dmeosntrate Lt knee flexion of 110 degrees to be able to squat to a low chair   Baseline 10/15/2014 Able to complete 5 STS from 12in step AROM 1-118   Status Achieved           PT Long Term Goals - 10/15/14 8546    PT LONG TERM GOAL #1   Title Patient will state 0 pain with ambualtion >110minutes   Status On-going   PT LONG TERM GOAL #2   Title Patient will be able to flex Lt knee 120 degrees to be able to croutch to work in garden.    PT LONG TERM GOAL #3   Title Patient will demonstrate 5/5 MMT strength for quadriceps and hamstrings to be able to go up and down stairs without UE support.    Status On-going               Plan - 10/15/14 2703    Clinical Impression Statement Session focus on improving AROM with focus on TKE and functional strengthening.  Progressed functional  strengthening, increased step up height with minimal difficulty.  Added squat walk arounds to improve Bil hip internal rotation to reduce strain on anterior knee during gait.  Began common lunge matrix with cueing for form and technique for gluteal and hamstring strengthening.  Edema is reducing with AROM 1-118 following stretches and strengthening exercises.     PT Next Visit Plan Continue with current POC including stretches, improve AROM wtih focus on knee extension, gait training and 3D hip excursion with focus on equal stance phase.        Problem List There are no active problems to display for this patient.  [address] [place]; [place], [place] Aldona Lento 10/15/2014, 11:59 AM  [place], [place], [address] Phone: [phone]   Fax:  [phone]

## 2014-10-17 ENCOUNTER — Ambulatory Visit (HOSPITAL_COMMUNITY): Payer: Managed Care, Other (non HMO)

## 2014-10-17 DIAGNOSIS — M25562 Pain in left knee: Secondary | ICD-10-CM

## 2014-10-17 DIAGNOSIS — R29898 Other symptoms and signs involving the musculoskeletal system: Secondary | ICD-10-CM

## 2014-10-17 DIAGNOSIS — Z471 Aftercare following joint replacement surgery: Secondary | ICD-10-CM | POA: Diagnosis not present

## 2014-10-17 DIAGNOSIS — Z96652 Presence of left artificial knee joint: Secondary | ICD-10-CM

## 2014-10-17 DIAGNOSIS — R262 Difficulty in walking, not elsewhere classified: Secondary | ICD-10-CM

## 2014-10-17 DIAGNOSIS — M25662 Stiffness of left knee, not elsewhere classified: Secondary | ICD-10-CM

## 2014-10-17 NOTE — Therapy (Signed)
[place], [place], [address] Phone: [phone]   Fax:  [phone]  Physical Therapy Treatment  Patient Details  Name: Evan Ayala MRN: [id number] Date of Birth: [birth year] Referring Provider:  Myra Rude, MD  Encounter Date: 10/17/2014      PT End of Session - 10/17/14 0803    Visit Number 10   Number of Visits 16   Date for PT Re-Evaluation 10/18/14   Authorization Type [employer]   Authorization - Visit Number 10   Authorization - Number of Visits 16   PT Start Time 0800   PT Stop Time 0846   PT Time Calculation (min) 46 min   Activity Tolerance Patient tolerated treatment well   Behavior During Therapy [facility] for tasks assessed/performed      No past medical history on file.  No past surgical history on file.  There were no vitals filed for this visit.  Visit Diagnosis:  Status post total left knee replacement  Difficulty walking  Left knee pain  Knee stiffness, left  Weakness of left lower extremity      Subjective Assessment - 10/17/14 0801    Symptoms Pt stated very minimal pain today, rode his bike yesterday and did a lot of outdoor activiities.  Pain scale .5/10   How long can you walk comfortably? Able to walk for an hour comfortably 20 minutes   Currently in Pain? Yes   Pain Score 1    Pain Location Knee   Pain Orientation Left;Medial   Pain Descriptors / Indicators Sore            [facility] PT Assessment - 10/17/14 0847    Assessment   Medical Diagnosis Lt TKA resulting ion diffiulty walking.    Onset Date 08/27/14   Next MD Visit Rhett Hallows, 10/18/14   Prior Therapy 6x with HHPT.    AROM   Right Knee Flexion 124  was 124   Left Knee Extension 1  was 2   Strength   Strength Assessment Site Knee;Hip;Ankle   Right/Left Hip Right;Left   Right Hip Flexion 5/5   Left Hip Flexion 5/5  was 4/5   Right/Left Knee Right;Left   Right Knee Flexion 5/5   Right Knee  Extension 5/5   Left Knee Flexion 5/5  was 2+/5   Left Knee Extension 5/5  was 2+/5   Right Ankle Dorsiflexion 5/5   Left Ankle Dorsiflexion 5/5  was 4+/5                   [facility] Adult PT Treatment/Exercise - 10/17/14 0001    Exercises   Exercises Knee/Hip   Knee/Hip Exercises: Stretches   Active Hamstring Stretch 3 reps;30 seconds   Active Hamstring Stretch Limitations 14 in step 3 directions   Quad Stretch 3 reps;30 seconds   Quad Stretch Limitations prone with rope   Knee: Self-Stretch Limitations knee drives 3 way on [address] step 10x each with 3 second hold   Gastroc Stretch 3 reps;30 seconds   Gastroc Stretch Limitations slant board   Knee/Hip Exercises: Standing   Heel Raises Limitations   Heel Raises Limitations 250 feet heel walking   Forward Lunges Both;10 reps   Forward Lunges Limitations Common and uncommon lunge matrix    Side Lunges Both;10 reps   Side Lunges Limitations Common and uncommon lunge matrix    Knee/Hip Exercises: Supine   Short Arc Quad Sets 20 reps   Heel  Slides 5 reps   Heel Slides Limitations 1-124 degrees   Knee/Hip Exercises: Machines for Strengthening   Cybex Knee Extension 2PL Lt LE only   Cybex Knee Flexion 635 Pl                PT Education - 10/17/14 0953    Education provided Yes   Education Details Given common and uncommon lunge matrix, [employer] membership printout   Person(s) Educated Patient   Methods Explanation;Demonstration;Handout   Comprehension Verbalized understanding;Returned demonstration          PT Short Term Goals - 10/17/14 0804    PT SHORT TERM GOAL #1   Title Patien twiill be independent with HEP   Baseline 3x daily   Status Achieved   PT SHORT TERM GOAL #2   Title Patint will be able to dmeosntrate full Lt knee extenion to 0 degrees   Baseline 10/17/2014 AROM 1-124   Status On-going   PT SHORT TERM GOAL #3   Title Patient will be able to dmeosntrate Lt knee flexion of 110 degrees to be able  to squat to a low chair   Baseline 10/15/2014 Able to complete 5 STS from 12in step AROM 1-118   Status Achieved           PT Long Term Goals - 10/17/14 0804    PT LONG TERM GOAL #1   Title Patient will state 0 pain with ambualtion >66mnutes   Status Achieved   PT LONG TERM GOAL #2   Title Patient will be able to flex Lt knee 120 degrees to be able to croutch to work in garden.    Baseline Reports ability to plant potatos no problem AROM 1-124   Status Achieved   PT LONG TERM GOAL #3   Title Patient will demonstrate 5/5 MMT strength for quadriceps and hamstrings to be able to go up and down stairs without UE support.    Status Achieved               Plan - 10/17/14 0933    Clinical Impression Statement Reviewed goals prior MD apt tomorrow.  Pt progressing well with all goals met except for full extension.  AROM 1-124.  Pt reports compliance with HEP and able to demonstrate or verbalize appropriate techniques with all exercises.  Strength 5/5 for all musculature and reports of no difficulty with functioanl tasks.  No more skilled  intervention required.  Pt given [employer] membership sheet and advanced HEP to continue to at home.    PT Next Visit Plan Recommend discharge for all goals met/partial met.        Problem List There are no active problems to display for this patient.  [address] [place] [phone] Evan Lento3/31/2016, 9:57 AM  [place] [address]Phone: [phone]  Fax:  [phone]    PHYSICAL THERAPY DISCHARGE SUMMARY  Visits from Start of Care: 10  Current functional level related to goals / functional outcomes: All goals met  Plan: Patient agrees to discharge.  Patient goals were met. Patient is being discharged due to meeting the stated rehab goals.  ?????       CDevona KonigPT DPT [phone]

## 2014-10-17 NOTE — Addendum Note (Signed)
Addended by: Devona Konig R on: 10/17/2014 03:11 PM   Modules accepted: Orders

## 2014-10-22 ENCOUNTER — Ambulatory Visit (HOSPITAL_COMMUNITY): Payer: Managed Care, Other (non HMO) | Admitting: Physical Therapy

## 2014-10-24 ENCOUNTER — Encounter (HOSPITAL_COMMUNITY): Payer: Managed Care, Other (non HMO)

## 2014-10-29 ENCOUNTER — Encounter (HOSPITAL_COMMUNITY): Payer: Managed Care, Other (non HMO)

## 2014-10-31 ENCOUNTER — Encounter (HOSPITAL_COMMUNITY): Payer: Managed Care, Other (non HMO)

## 2014-11-05 ENCOUNTER — Encounter (HOSPITAL_COMMUNITY): Payer: Managed Care, Other (non HMO) | Admitting: Physical Therapy

## 2014-11-07 ENCOUNTER — Encounter (HOSPITAL_COMMUNITY): Payer: Managed Care, Other (non HMO)

## 2014-11-12 ENCOUNTER — Encounter (HOSPITAL_COMMUNITY): Payer: Managed Care, Other (non HMO) | Admitting: Physical Therapy

## 2014-11-14 ENCOUNTER — Encounter (HOSPITAL_COMMUNITY): Payer: Managed Care, Other (non HMO)

## 2017-02-22 DIAGNOSIS — Z1283 Encounter for screening for malignant neoplasm of skin: Secondary | ICD-10-CM | POA: Diagnosis not present

## 2017-02-22 DIAGNOSIS — L821 Other seborrheic keratosis: Secondary | ICD-10-CM | POA: Diagnosis not present

## 2017-02-22 DIAGNOSIS — L570 Actinic keratosis: Secondary | ICD-10-CM | POA: Diagnosis not present

## 2017-02-22 DIAGNOSIS — X32XXXD Exposure to sunlight, subsequent encounter: Secondary | ICD-10-CM | POA: Diagnosis not present

## 2017-09-27 DIAGNOSIS — X32XXXD Exposure to sunlight, subsequent encounter: Secondary | ICD-10-CM | POA: Diagnosis not present

## 2017-09-27 DIAGNOSIS — L570 Actinic keratosis: Secondary | ICD-10-CM | POA: Diagnosis not present

## 2018-02-21 DIAGNOSIS — Z1283 Encounter for screening for malignant neoplasm of skin: Secondary | ICD-10-CM | POA: Diagnosis not present

## 2018-02-21 DIAGNOSIS — X32XXXD Exposure to sunlight, subsequent encounter: Secondary | ICD-10-CM | POA: Diagnosis not present

## 2018-02-21 DIAGNOSIS — L570 Actinic keratosis: Secondary | ICD-10-CM | POA: Diagnosis not present

## 2018-02-21 DIAGNOSIS — D225 Melanocytic nevi of trunk: Secondary | ICD-10-CM | POA: Diagnosis not present

## 2018-03-13 DIAGNOSIS — H538 Other visual disturbances: Secondary | ICD-10-CM | POA: Diagnosis not present

## 2018-03-13 DIAGNOSIS — R7301 Impaired fasting glucose: Secondary | ICD-10-CM | POA: Diagnosis not present

## 2018-03-13 DIAGNOSIS — E782 Mixed hyperlipidemia: Secondary | ICD-10-CM | POA: Diagnosis not present

## 2018-03-13 DIAGNOSIS — Z23 Encounter for immunization: Secondary | ICD-10-CM | POA: Diagnosis not present

## 2018-03-13 DIAGNOSIS — M25569 Pain in unspecified knee: Secondary | ICD-10-CM | POA: Diagnosis not present

## 2018-03-13 DIAGNOSIS — I10 Essential (primary) hypertension: Secondary | ICD-10-CM | POA: Diagnosis not present

## 2018-03-13 DIAGNOSIS — Z6829 Body mass index (BMI) 29.0-29.9, adult: Secondary | ICD-10-CM | POA: Diagnosis not present

## 2018-03-16 DIAGNOSIS — E782 Mixed hyperlipidemia: Secondary | ICD-10-CM | POA: Diagnosis not present

## 2018-03-16 DIAGNOSIS — M25569 Pain in unspecified knee: Secondary | ICD-10-CM | POA: Diagnosis not present

## 2018-03-16 DIAGNOSIS — Z6829 Body mass index (BMI) 29.0-29.9, adult: Secondary | ICD-10-CM | POA: Diagnosis not present

## 2018-03-16 DIAGNOSIS — R7301 Impaired fasting glucose: Secondary | ICD-10-CM | POA: Diagnosis not present

## 2018-03-16 DIAGNOSIS — Z0000 Encounter for general adult medical examination without abnormal findings: Secondary | ICD-10-CM | POA: Diagnosis not present

## 2018-03-16 DIAGNOSIS — I10 Essential (primary) hypertension: Secondary | ICD-10-CM | POA: Diagnosis not present

## 2018-03-16 DIAGNOSIS — E663 Overweight: Secondary | ICD-10-CM | POA: Diagnosis not present

## 2018-05-22 DIAGNOSIS — Z23 Encounter for immunization: Secondary | ICD-10-CM | POA: Diagnosis not present

## 2018-09-02 DIAGNOSIS — Z23 Encounter for immunization: Secondary | ICD-10-CM | POA: Diagnosis not present

## 2018-09-25 DIAGNOSIS — R7301 Impaired fasting glucose: Secondary | ICD-10-CM | POA: Diagnosis not present

## 2018-09-25 DIAGNOSIS — Z125 Encounter for screening for malignant neoplasm of prostate: Secondary | ICD-10-CM | POA: Diagnosis not present

## 2018-09-25 DIAGNOSIS — E663 Overweight: Secondary | ICD-10-CM | POA: Diagnosis not present

## 2018-09-25 DIAGNOSIS — I10 Essential (primary) hypertension: Secondary | ICD-10-CM | POA: Diagnosis not present

## 2018-09-25 DIAGNOSIS — E782 Mixed hyperlipidemia: Secondary | ICD-10-CM | POA: Diagnosis not present

## 2018-09-27 DIAGNOSIS — M1711 Unilateral primary osteoarthritis, right knee: Secondary | ICD-10-CM | POA: Diagnosis not present

## 2018-09-27 DIAGNOSIS — I10 Essential (primary) hypertension: Secondary | ICD-10-CM | POA: Diagnosis not present

## 2018-09-27 DIAGNOSIS — R7301 Impaired fasting glucose: Secondary | ICD-10-CM | POA: Diagnosis not present

## 2018-09-27 DIAGNOSIS — Z6829 Body mass index (BMI) 29.0-29.9, adult: Secondary | ICD-10-CM | POA: Diagnosis not present

## 2018-09-27 DIAGNOSIS — E782 Mixed hyperlipidemia: Secondary | ICD-10-CM | POA: Diagnosis not present

## 2019-02-27 DIAGNOSIS — B078 Other viral warts: Secondary | ICD-10-CM | POA: Diagnosis not present

## 2019-02-27 DIAGNOSIS — Z1283 Encounter for screening for malignant neoplasm of skin: Secondary | ICD-10-CM | POA: Diagnosis not present

## 2019-02-27 DIAGNOSIS — D225 Melanocytic nevi of trunk: Secondary | ICD-10-CM | POA: Diagnosis not present

## 2019-02-27 DIAGNOSIS — L570 Actinic keratosis: Secondary | ICD-10-CM | POA: Diagnosis not present

## 2019-02-27 DIAGNOSIS — X32XXXD Exposure to sunlight, subsequent encounter: Secondary | ICD-10-CM | POA: Diagnosis not present

## 2019-04-24 DIAGNOSIS — T23102A Burn of first degree of left hand, unspecified site, initial encounter: Secondary | ICD-10-CM | POA: Diagnosis not present

## 2019-04-30 DIAGNOSIS — I10 Essential (primary) hypertension: Secondary | ICD-10-CM | POA: Diagnosis not present

## 2019-04-30 DIAGNOSIS — R7301 Impaired fasting glucose: Secondary | ICD-10-CM | POA: Diagnosis not present

## 2019-04-30 DIAGNOSIS — E782 Mixed hyperlipidemia: Secondary | ICD-10-CM | POA: Diagnosis not present

## 2019-05-21 DIAGNOSIS — Z6829 Body mass index (BMI) 29.0-29.9, adult: Secondary | ICD-10-CM | POA: Diagnosis not present

## 2019-05-21 DIAGNOSIS — Z23 Encounter for immunization: Secondary | ICD-10-CM | POA: Diagnosis not present

## 2019-05-21 DIAGNOSIS — R7301 Impaired fasting glucose: Secondary | ICD-10-CM | POA: Diagnosis not present

## 2019-05-21 DIAGNOSIS — Z0000 Encounter for general adult medical examination without abnormal findings: Secondary | ICD-10-CM | POA: Diagnosis not present

## 2019-05-21 DIAGNOSIS — M1711 Unilateral primary osteoarthritis, right knee: Secondary | ICD-10-CM | POA: Diagnosis not present

## 2019-05-21 DIAGNOSIS — E782 Mixed hyperlipidemia: Secondary | ICD-10-CM | POA: Diagnosis not present

## 2019-05-21 DIAGNOSIS — I10 Essential (primary) hypertension: Secondary | ICD-10-CM | POA: Diagnosis not present

## 2019-08-01 ENCOUNTER — Ambulatory Visit: Payer: Medicare Other | Attending: Internal Medicine

## 2019-08-01 ENCOUNTER — Other Ambulatory Visit: Payer: Self-pay

## 2019-08-01 DIAGNOSIS — Z20822 Contact with and (suspected) exposure to covid-19: Secondary | ICD-10-CM

## 2019-08-02 ENCOUNTER — Telehealth: Payer: Self-pay | Admitting: *Deleted

## 2019-08-02 LAB — NOVEL CORONAVIRUS, NAA: SARS-CoV-2, NAA: NOT DETECTED

## 2019-08-02 NOTE — Telephone Encounter (Signed)
Patient called for results ,still pending . 

## 2019-08-20 DIAGNOSIS — H6123 Impacted cerumen, bilateral: Secondary | ICD-10-CM | POA: Diagnosis not present

## 2019-08-30 DIAGNOSIS — Z23 Encounter for immunization: Secondary | ICD-10-CM | POA: Diagnosis not present

## 2019-09-10 DIAGNOSIS — M79641 Pain in right hand: Secondary | ICD-10-CM | POA: Diagnosis not present

## 2019-09-10 DIAGNOSIS — M720 Palmar fascial fibromatosis [Dupuytren]: Secondary | ICD-10-CM | POA: Diagnosis not present

## 2019-09-10 DIAGNOSIS — M79642 Pain in left hand: Secondary | ICD-10-CM | POA: Diagnosis not present

## 2019-09-25 DIAGNOSIS — L570 Actinic keratosis: Secondary | ICD-10-CM | POA: Diagnosis not present

## 2019-09-25 DIAGNOSIS — L818 Other specified disorders of pigmentation: Secondary | ICD-10-CM | POA: Diagnosis not present

## 2019-09-25 DIAGNOSIS — L814 Other melanin hyperpigmentation: Secondary | ICD-10-CM | POA: Diagnosis not present

## 2019-09-25 DIAGNOSIS — X32XXXD Exposure to sunlight, subsequent encounter: Secondary | ICD-10-CM | POA: Diagnosis not present

## 2019-09-25 DIAGNOSIS — B078 Other viral warts: Secondary | ICD-10-CM | POA: Diagnosis not present

## 2019-09-28 DIAGNOSIS — Z23 Encounter for immunization: Secondary | ICD-10-CM | POA: Diagnosis not present

## 2019-10-04 DIAGNOSIS — Z96652 Presence of left artificial knee joint: Secondary | ICD-10-CM | POA: Diagnosis not present

## 2019-10-04 DIAGNOSIS — Z471 Aftercare following joint replacement surgery: Secondary | ICD-10-CM | POA: Diagnosis not present

## 2019-11-01 ENCOUNTER — Encounter: Payer: Self-pay | Admitting: Internal Medicine

## 2019-11-14 ENCOUNTER — Other Ambulatory Visit: Payer: Managed Care, Other (non HMO)

## 2019-11-19 DIAGNOSIS — E782 Mixed hyperlipidemia: Secondary | ICD-10-CM | POA: Diagnosis not present

## 2019-11-19 DIAGNOSIS — M1711 Unilateral primary osteoarthritis, right knee: Secondary | ICD-10-CM | POA: Diagnosis not present

## 2019-11-19 DIAGNOSIS — R7301 Impaired fasting glucose: Secondary | ICD-10-CM | POA: Diagnosis not present

## 2019-11-19 DIAGNOSIS — I10 Essential (primary) hypertension: Secondary | ICD-10-CM | POA: Diagnosis not present

## 2019-11-19 DIAGNOSIS — Z6829 Body mass index (BMI) 29.0-29.9, adult: Secondary | ICD-10-CM | POA: Diagnosis not present

## 2019-11-19 DIAGNOSIS — Z0000 Encounter for general adult medical examination without abnormal findings: Secondary | ICD-10-CM | POA: Diagnosis not present

## 2019-12-28 DIAGNOSIS — S20469A Insect bite (nonvenomous) of unspecified back wall of thorax, initial encounter: Secondary | ICD-10-CM | POA: Diagnosis not present

## 2019-12-28 DIAGNOSIS — L0390 Cellulitis, unspecified: Secondary | ICD-10-CM | POA: Diagnosis not present

## 2020-02-04 DIAGNOSIS — J309 Allergic rhinitis, unspecified: Secondary | ICD-10-CM | POA: Diagnosis not present

## 2020-02-04 DIAGNOSIS — I10 Essential (primary) hypertension: Secondary | ICD-10-CM | POA: Diagnosis not present

## 2020-02-14 DIAGNOSIS — J069 Acute upper respiratory infection, unspecified: Secondary | ICD-10-CM | POA: Diagnosis not present

## 2020-03-01 ENCOUNTER — Other Ambulatory Visit (HOSPITAL_COMMUNITY): Payer: Self-pay | Admitting: Internal Medicine

## 2020-03-01 ENCOUNTER — Ambulatory Visit (HOSPITAL_COMMUNITY)
Admission: RE | Admit: 2020-03-01 | Discharge: 2020-03-01 | Disposition: A | Discharge status: Home / Self Care | Payer: Medicare Other | Source: Ambulatory Visit | Attending: Internal Medicine | Admitting: Internal Medicine

## 2020-03-01 ENCOUNTER — Other Ambulatory Visit: Payer: Self-pay

## 2020-03-01 DIAGNOSIS — R062 Wheezing: Secondary | ICD-10-CM

## 2020-03-01 DIAGNOSIS — R05 Cough: Secondary | ICD-10-CM | POA: Diagnosis not present

## 2020-03-01 DIAGNOSIS — R059 Cough, unspecified: Secondary | ICD-10-CM

## 2020-03-01 DIAGNOSIS — J069 Acute upper respiratory infection, unspecified: Secondary | ICD-10-CM | POA: Diagnosis not present

## 2020-03-04 DIAGNOSIS — H5203 Hypermetropia, bilateral: Secondary | ICD-10-CM | POA: Diagnosis not present

## 2020-03-04 DIAGNOSIS — H40013 Open angle with borderline findings, low risk, bilateral: Secondary | ICD-10-CM | POA: Diagnosis not present

## 2020-03-10 DIAGNOSIS — J309 Allergic rhinitis, unspecified: Secondary | ICD-10-CM | POA: Diagnosis not present

## 2020-03-10 DIAGNOSIS — I4891 Unspecified atrial fibrillation: Secondary | ICD-10-CM | POA: Insufficient documentation

## 2020-03-10 DIAGNOSIS — I480 Paroxysmal atrial fibrillation: Secondary | ICD-10-CM | POA: Diagnosis not present

## 2020-03-10 DIAGNOSIS — I10 Essential (primary) hypertension: Secondary | ICD-10-CM | POA: Diagnosis not present

## 2020-03-10 DIAGNOSIS — I509 Heart failure, unspecified: Secondary | ICD-10-CM | POA: Diagnosis not present

## 2020-03-10 DIAGNOSIS — Z6829 Body mass index (BMI) 29.0-29.9, adult: Secondary | ICD-10-CM | POA: Diagnosis not present

## 2020-03-10 DIAGNOSIS — E782 Mixed hyperlipidemia: Secondary | ICD-10-CM | POA: Diagnosis not present

## 2020-03-10 DIAGNOSIS — Z712 Person consulting for explanation of examination or test findings: Secondary | ICD-10-CM | POA: Diagnosis not present

## 2020-03-10 DIAGNOSIS — R7301 Impaired fasting glucose: Secondary | ICD-10-CM | POA: Diagnosis not present

## 2020-03-10 DIAGNOSIS — Z23 Encounter for immunization: Secondary | ICD-10-CM | POA: Diagnosis not present

## 2020-03-10 DIAGNOSIS — J069 Acute upper respiratory infection, unspecified: Secondary | ICD-10-CM | POA: Diagnosis not present

## 2020-03-10 DIAGNOSIS — H538 Other visual disturbances: Secondary | ICD-10-CM | POA: Diagnosis not present

## 2020-03-10 DIAGNOSIS — L0390 Cellulitis, unspecified: Secondary | ICD-10-CM | POA: Diagnosis not present

## 2020-03-10 DIAGNOSIS — E663 Overweight: Secondary | ICD-10-CM | POA: Diagnosis not present

## 2020-03-10 DIAGNOSIS — M25569 Pain in unspecified knee: Secondary | ICD-10-CM | POA: Diagnosis not present

## 2020-03-17 DIAGNOSIS — M25569 Pain in unspecified knee: Secondary | ICD-10-CM | POA: Diagnosis not present

## 2020-03-17 DIAGNOSIS — Z136 Encounter for screening for cardiovascular disorders: Secondary | ICD-10-CM | POA: Diagnosis not present

## 2020-03-17 DIAGNOSIS — L0390 Cellulitis, unspecified: Secondary | ICD-10-CM | POA: Diagnosis not present

## 2020-03-17 DIAGNOSIS — Z6829 Body mass index (BMI) 29.0-29.9, adult: Secondary | ICD-10-CM | POA: Diagnosis not present

## 2020-03-17 DIAGNOSIS — I480 Paroxysmal atrial fibrillation: Secondary | ICD-10-CM | POA: Diagnosis not present

## 2020-03-17 DIAGNOSIS — I10 Essential (primary) hypertension: Secondary | ICD-10-CM | POA: Diagnosis not present

## 2020-03-17 DIAGNOSIS — R7301 Impaired fasting glucose: Secondary | ICD-10-CM | POA: Diagnosis not present

## 2020-03-17 DIAGNOSIS — I509 Heart failure, unspecified: Secondary | ICD-10-CM | POA: Diagnosis not present

## 2020-03-17 DIAGNOSIS — E782 Mixed hyperlipidemia: Secondary | ICD-10-CM | POA: Diagnosis not present

## 2020-03-17 DIAGNOSIS — J069 Acute upper respiratory infection, unspecified: Secondary | ICD-10-CM | POA: Diagnosis not present

## 2020-03-17 DIAGNOSIS — H538 Other visual disturbances: Secondary | ICD-10-CM | POA: Diagnosis not present

## 2020-03-17 DIAGNOSIS — E663 Overweight: Secondary | ICD-10-CM | POA: Diagnosis not present

## 2020-03-21 ENCOUNTER — Other Ambulatory Visit: Payer: Self-pay

## 2020-03-21 ENCOUNTER — Ambulatory Visit (INDEPENDENT_AMBULATORY_CARE_PROVIDER_SITE_OTHER): Payer: Medicare Other | Admitting: Internal Medicine

## 2020-03-21 VITALS — BP 122/80 | HR 129 | Ht 73.0 in | Wt 203.0 lb

## 2020-03-21 DIAGNOSIS — I4891 Unspecified atrial fibrillation: Secondary | ICD-10-CM | POA: Diagnosis not present

## 2020-03-21 DIAGNOSIS — R0602 Shortness of breath: Secondary | ICD-10-CM | POA: Diagnosis not present

## 2020-03-21 MED ORDER — ATENOLOL 25 MG PO TABS: 25.0000 mg | ORAL_TABLET | Freq: Two times a day (BID) | ORAL | 3 refills | Status: DC

## 2020-03-21 NOTE — Patient Instructions (Signed)
Medication Instructions:  Your physician has recommended you make the following change in your medication:  1.) stop carvedilol 2.) start atenolol 25 mg to twice a day DO NOT MISS ANY DOSES OF YOUR BLOOD THINNER  *If you need a refill on your cardiac medications before your next appointment, please call your pharmacy*   Lab Work: Today: tsh, bmet, bnp  If you have labs (blood work) drawn today and your tests are completely normal, you will receive your results only by:  [facility] (if you have MyChart) OR  A paper copy in the mail If you have any lab test that is abnormal or we need to change your treatment, we will call you to review the results.   Testing/Procedures: Your physician has requested that you have an echocardiogram. Echocardiography is a painless test that uses sound waves to create images of your heart. It provides your doctor with information about the size and shape of your heart and how well your hearts chambers and valves are working. This procedure takes approximately one hour. There are no restrictions for this procedure.  Your physician has recommended that you have a Cardioversion (DCCV). Electrical Cardioversion uses a jolt of electricity to your heart either through paddles or wired patches attached to your chest. This is a controlled, usually prescheduled, procedure. Defibrillation is done under light anesthesia in the hospital, and you usually go home the day of the procedure. This is done to get your heart back into a normal rhythm. You are not awake for the procedure. Please see the instruction sheet given to you today.   Follow-Up: At [facility], you and your health needs are our priority.  As part of our continuing mission to provide you with exceptional heart care, we have created designated Provider Care Teams.  These Care Teams include your primary Cardiologist (physician) and Advanced Practice Providers (APPs -  Physician Assistants and Nurse  Practitioners) who all work together to provide you with the care you need, when you need it.    Your next appointment:   6 week(s)   Oct 11 in [facility]  The format for your next appointment:   In Person  Provider:   You may see Dorris Carnes, MD or one of the following Advanced Practice Providers on your designated Care Team:    Bernerd Pho, PA-C   Ermalinda Barrios, PA-C     Other Instructions Keep track of BP at home. We will plan cardioversion for September 20, 21 or 22.  I will forward instructions to you in MyChart.  We will need lab work again prior, which you can do at the [employer] [facility] [facility] in [place] or the hospital.

## 2020-03-21 NOTE — Progress Notes (Signed)
Cardiology Office Note   Date:  03/21/2020   ID:  Evan Ayala, DOB [birth year], MRN [id number]  PCP:  Celene Squibb, MD  Cardiologist:   Dorris Carnes, MD   Pt is referred by Dr Evan Ayala office for evaluation of atrial fibrillation     History of Present Illness: Evan Ayala is a 69 y.o. male with a history ofHTN   He is followed by Evan Ayala  The pt reports that about 6 wks ago he woke up and heard some whistling in his  lungs   He says that he was seen in Dr Evan Ayala office  An Rx was given for antihistamine   The following week he had a fever   Rx with  a Z pac.   Pt then developed lung congestion   CXR showed cardiomegaly and vasc congestion.  Given Rx for prednisone and Lasix   After that he continued to have chest congestion.  Given Rx for Lasix    EKG done t found to be in afib   Meds changes    Talking to the pt he denies palpitations  Breathing is fair.   No CP   No dizziness      Current Meds  Medication Sig  . carvedilol (COREG) 3.125 MG tablet Take 3.125 mg by mouth 2 (two) times daily.  Marland Kitchen diltiazem (CARDIZEM CD) 120 MG 24 hr capsule Take 120 mg by mouth daily.  Marland Kitchen ELIQUIS 5 MG TABS tablet Take 5 mg by mouth 2 (two) times daily.  . furosemide (LASIX) 40 MG tablet Take 40 mg by mouth daily.  . potassium chloride (KLOR-CON) 10 MEQ tablet Take 10 mEq by mouth daily.  . rosuvastatin (CRESTOR) 10 MG tablet Take 10 mg by mouth daily.     Allergies:   Patient has no allergy information on record.   No past medical history on file.     Social History:  The patient     Family History:  The patient's family history is not on file.    ROS:  Please see the history of present illness. All other systems are reviewed and  Negative to the above problem except as noted.    PHYSICAL EXAM: VS:  BP 122/80   Pulse (!) 129   Ht 6\' 1"  (1.854 m)   Wt 203 lb (92.1 kg)   SpO2 96%   BMI 26.78 kg/m   GEN: Well nourished, well developed, in no acute distress  HEENT: normal  Neck: no  JVD, carotid bruits   Cardiac:  Irreg irrreg   No S3  No signif murmurs   No LE  edema  Respiratory:  clear to auscultation bilaterally, no rales or wheezes GI: soft, nontender, nondistended, + BS  No hepatomegaly  MS: no deformity Moving all extremities   Skin: warm and dry, no rash Neuro:  Strength and sensation are intact Psych: euthymic mood, full affect   EKG:  EKG is ordered today.  Atrial fibillation   129 bpm  LAD     Lipid Panel No results found for: CHOL, TRIG, HDL, CHOLHDL, VLDL, LDLCALC, LDLDIRECT    Wt Readings from Last 3 Encounters:  03/21/20 203 lb (92.1 kg)      ASSESSMENT AND PLAN:  1  Atrial fibrillation   Onset unclear   Currently rate is not controlled  CHADSVASc is 1     For now would d/c coreg  Increase atenolol to 25 bid   Follow HR and BP  Conitnue diltiazem Check TSH, BMET, BNP Check echo Plan for cardioversion in [facility] in September   2   HTN  BP is controlled  Follow on current meds   3   HCM  Will need to follow lipids      Current medicines are reviewed at length with the patient today.  The patient does not have concerns regarding medicines.  Signed, Dorris Carnes, MD  03/21/2020 2:24 PM    [facility] [address], [place], [place]  [address] Phone: [phone]; Fax: [phone]

## 2020-03-22 LAB — BASIC METABOLIC PANEL
BUN/Creatinine Ratio: 17 (ref 10–24)
BUN: 18 mg/dL (ref 8–27)
CO2: 25 mmol/L (ref 20–29)
Calcium: 9.6 mg/dL (ref 8.6–10.2)
Chloride: 98 mmol/L (ref 96–106)
Creatinine, Ser: 1.03 mg/dL (ref 0.76–1.27)
GFR calc Af Amer: 86 mL/min/{1.73_m2} (ref >59)
GFR calc non Af Amer: 74 mL/min/{1.73_m2} (ref >59)
Glucose: 110 mg/dL — ABNORMAL HIGH (ref 65–99)
Potassium: 4.7 mmol/L (ref 3.5–5.2)
Sodium: 137 mmol/L (ref 134–144)

## 2020-03-22 LAB — TSH: TSH: 2.69 u[IU]/mL (ref 0.450–4.500)

## 2020-03-22 LAB — PRO B NATRIURETIC PEPTIDE: NT-Pro BNP: 2590 pg/mL — ABNORMAL HIGH (ref 0–376)

## 2020-03-26 ENCOUNTER — Telehealth: Payer: Self-pay | Admitting: Internal Medicine

## 2020-03-26 ENCOUNTER — Ambulatory Visit (HOSPITAL_COMMUNITY)
Admission: RE | Admit: 2020-03-26 | Discharge: 2020-03-26 | Disposition: A | Discharge status: Home / Self Care | Payer: Medicare Other | Source: Ambulatory Visit | Attending: Internal Medicine | Admitting: Internal Medicine

## 2020-03-26 ENCOUNTER — Other Ambulatory Visit: Payer: Self-pay

## 2020-03-26 DIAGNOSIS — I4891 Unspecified atrial fibrillation: Secondary | ICD-10-CM | POA: Diagnosis not present

## 2020-03-26 DIAGNOSIS — R0602 Shortness of breath: Secondary | ICD-10-CM

## 2020-03-26 DIAGNOSIS — Z01812 Encounter for preprocedural laboratory examination: Secondary | ICD-10-CM

## 2020-03-26 LAB — ECHOCARDIOGRAM COMPLETE
AR max vel: 2.46 cm2
AV Area VTI: 2.05 cm2
AV Area mean vel: 2.32 cm2
AV Mean grad: 2.1 mmHg
AV Peak grad: 3.5 mmHg
Ao pk vel: 0.93 m/s
Area-P 1/2: 3.19 cm2
Calc EF: 47.8 %
S' Lateral: 4.75 cm
Single Plane A2C EF: 36.1 %
Single Plane A4C EF: 52.9 %

## 2020-03-26 NOTE — Telephone Encounter (Signed)
Patient is returning call to discuss results from lab work completed on 03/21/20.

## 2020-03-26 NOTE — Progress Notes (Signed)
*  PRELIMINARY RESULTS* Echocardiogram 2D Echocardiogram has been performed.  Evan Ayala 03/26/2020, 12:31 PM

## 2020-03-27 ENCOUNTER — Encounter: Payer: Self-pay | Admitting: *Deleted

## 2020-03-27 NOTE — Telephone Encounter (Signed)
Yes, OK to wait

## 2020-03-27 NOTE — Telephone Encounter (Signed)
Reached patient at home. Reviewed plan for repeat labs and also instructions for DCCV at [facility] on 04/10/20, 9:30 am w Dr. Harl Bowie.  Pre op labs and Covid screening are scheduled for 04/08/20, 1:15 pm. Instruction letter sent to his MyChart.

## 2020-04-01 ENCOUNTER — Telehealth: Payer: Self-pay | Admitting: Internal Medicine

## 2020-04-01 NOTE — Telephone Encounter (Signed)
Pt walked in today. Pt would like someone to check his BP and his BP monitor to see if it is working.   Please call  [phone]

## 2020-04-02 ENCOUNTER — Encounter: Payer: Self-pay | Admitting: Internal Medicine

## 2020-04-02 ENCOUNTER — Ambulatory Visit (INDEPENDENT_AMBULATORY_CARE_PROVIDER_SITE_OTHER): Payer: Medicare Other | Admitting: *Deleted

## 2020-04-02 ENCOUNTER — Other Ambulatory Visit: Payer: Self-pay

## 2020-04-02 VITALS — BP 122/80 | HR 90 | Ht 73.0 in | Wt 197.0 lb

## 2020-04-02 DIAGNOSIS — Z0130 Encounter for examination of blood pressure without abnormal findings: Secondary | ICD-10-CM

## 2020-04-02 NOTE — Telephone Encounter (Signed)
Called pt no answer. Left msg to call back.  

## 2020-04-02 NOTE — Progress Notes (Signed)
Pt in office to have BP check d/t increase in atenolol from 25 mg BID to 50 mg BID. Pt states that he feels great. BP log scanned to Dr. Harrington Challenger

## 2020-04-02 NOTE — Telephone Encounter (Signed)
Spoke with pt and he will come in today for BP check

## 2020-04-08 ENCOUNTER — Encounter (HOSPITAL_COMMUNITY): Payer: Self-pay

## 2020-04-08 ENCOUNTER — Encounter (HOSPITAL_COMMUNITY)
Admission: RE | Admit: 2020-04-08 | Discharge: 2020-04-08 | Disposition: A | Discharge status: Home / Self Care | Payer: Medicare Other | Source: Ambulatory Visit | Attending: Cardiology | Admitting: Cardiology

## 2020-04-08 ENCOUNTER — Other Ambulatory Visit: Payer: Self-pay | Admitting: Cardiology

## 2020-04-08 ENCOUNTER — Other Ambulatory Visit: Payer: Self-pay

## 2020-04-08 ENCOUNTER — Other Ambulatory Visit (HOSPITAL_COMMUNITY)
Admission: RE | Admit: 2020-04-08 | Discharge: 2020-04-08 | Disposition: A | Discharge status: Home / Self Care | Payer: Medicare Other | Source: Ambulatory Visit | Attending: Cardiology | Admitting: Cardiology

## 2020-04-08 DIAGNOSIS — Z01812 Encounter for preprocedural laboratory examination: Secondary | ICD-10-CM | POA: Insufficient documentation

## 2020-04-08 DIAGNOSIS — Z20822 Contact with and (suspected) exposure to covid-19: Secondary | ICD-10-CM | POA: Diagnosis not present

## 2020-04-08 HISTORY — DX: Pure hypercholesterolemia, unspecified: E78.00

## 2020-04-08 HISTORY — DX: Cardiac arrhythmia, unspecified: I49.9

## 2020-04-08 LAB — CBC WITH DIFFERENTIAL/PLATELET
Abs Immature Granulocytes: 0.03 10*3/uL (ref 0.00–0.07)
Basophils Absolute: 0 10*3/uL (ref 0.0–0.1)
Basophils Relative: 0 %
Eosinophils Absolute: 0.2 10*3/uL (ref 0.0–0.5)
Eosinophils Relative: 2 %
HCT: 53.5 % — ABNORMAL HIGH (ref 39.0–52.0)
Hemoglobin: 17.5 g/dL — ABNORMAL HIGH (ref 13.0–17.0)
Immature Granulocytes: 0 %
Lymphocytes Relative: 19 %
Lymphs Abs: 1.8 10*3/uL (ref 0.7–4.0)
MCH: 30 pg (ref 26.0–34.0)
MCHC: 32.7 g/dL (ref 30.0–36.0)
MCV: 91.6 fL (ref 80.0–100.0)
Monocytes Absolute: 1.1 10*3/uL — ABNORMAL HIGH (ref 0.1–1.0)
Monocytes Relative: 12 %
Neutro Abs: 6.4 10*3/uL (ref 1.7–7.7)
Neutrophils Relative %: 67 %
Platelets: 209 10*3/uL (ref 150–400)
RBC: 5.84 MIL/uL — ABNORMAL HIGH (ref 4.22–5.81)
RDW: 13.4 % (ref 11.5–15.5)
WBC: 9.6 10*3/uL (ref 4.0–10.5)
nRBC: 0 % (ref 0.0–0.2)

## 2020-04-08 LAB — BASIC METABOLIC PANEL
Anion gap: 12 (ref 5–15)
BUN: 22 mg/dL (ref 8–23)
CO2: 23 mmol/L (ref 22–32)
Calcium: 9.4 mg/dL (ref 8.9–10.3)
Chloride: 104 mmol/L (ref 98–111)
Creatinine, Ser: 0.84 mg/dL (ref 0.61–1.24)
GFR calc Af Amer: >60 mL/min (ref >60)
GFR calc non Af Amer: >60 mL/min (ref >60)
Glucose, Bld: 96 mg/dL (ref 70–99)
Potassium: 4.5 mmol/L (ref 3.5–5.1)
Sodium: 139 mmol/L (ref 135–145)

## 2020-04-08 LAB — BRAIN NATRIURETIC PEPTIDE: B Natriuretic Peptide: 582 pg/mL — ABNORMAL HIGH (ref 0.0–100.0)

## 2020-04-08 NOTE — Patient Instructions (Signed)
Evan Ayala  04/08/2020     @PREFPERIOPPHARMACY @   Your procedure is scheduled on  04/10/2020  Report to [facility] at  0800  A.M.  Call this number if you have problems the morning of surgery:  [phone]   Remember:  Do not eat or drink after midnight.                         Take these medicines the morning of surgery with A SIP OF WATER  Eliquis.    Do not wear jewelry, make-up or nail polish.  Do not wear lotions, powders, or perfumes. Please wear deodorant and brush your teeth  Do not shave 48 hours prior to surgery.  Men may shave face and neck.  Do not bring valuables to the hospital.  [facility] is not responsible for any belongings or valuables.  Contacts, dentures or bridgework may not be worn into surgery.  Leave your suitcase in the car.  After surgery it may be brought to your room.  For patients admitted to the hospital, discharge time will be determined by your treatment team.  Patients discharged the day of surgery will not be allowed to drive home.   Name and phone number of your driver:  Wife. Special instructions:  None  Please read over the following fact sheets that you were given. Anesthesia Post-op Instructions and Care and Recovery After Surgery       Electrical Cardioversion Electrical cardioversion is the delivery of a jolt of electricity to restore a normal rhythm to the heart. A rhythm that is too fast or is not regular keeps the heart from pumping well. In this procedure, sticky patches or metal paddles are placed on the chest to deliver electricity to the heart from a device. This procedure may be done in an emergency if:  There is low or no blood pressure as a result of the heart rhythm.  Normal rhythm must be restored as fast as possible to protect the brain and heart from further damage.  It may save a life. This may also be a scheduled procedure for irregular or fast heart rhythms that are not immediately  life-threatening. Tell a health care provider about:  Any allergies you have.  All medicines you are taking, including vitamins, herbs, eye drops, creams, and over-the-counter medicines.  Any problems you or family members have had with anesthetic medicines.  Any blood disorders you have.  Any surgeries you have had.  Any medical conditions you have.  Whether you are pregnant or may be pregnant. What are the risks? Generally, this is a safe procedure. However, problems may occur, including:  Allergic reactions to medicines.  A blood clot that breaks free and travels to other parts of your body.  The possible return of an abnormal heart rhythm within hours or days after the procedure.  Your heart stopping (cardiac arrest). This is rare. What happens before the procedure? Medicines  Your health care provider may have you start taking: ? Blood-thinning medicines (anticoagulants) so your blood does not clot as easily. ? Medicines to help stabilize your heart rate and rhythm.  Ask your health care provider about: ? Changing or stopping your regular medicines. This is especially important if you are taking diabetes medicines or blood thinners. ? Taking medicines such as aspirin and ibuprofen. These medicines can thin your blood. Do not take these medicines unless your health care provider  tells you to take them. ? Taking over-the-counter medicines, vitamins, herbs, and supplements. General instructions  Follow instructions from your health care provider about eating or drinking restrictions.  Plan to have someone take you home from the hospital or clinic.  If you will be going home right after the procedure, plan to have someone with you for 24 hours.  Ask your health care provider what steps will be taken to help prevent infection. These may include washing your skin with a germ-killing soap. What happens during the procedure?   An IV will be inserted into one of your  veins.  Sticky patches (electrodes) or metal paddles may be placed on your chest.  You will be given a medicine to help you relax (sedative).  An electrical shock will be delivered. The procedure may vary among health care providers and hospitals. What can I expect after the procedure?  Your blood pressure, heart rate, breathing rate, and blood oxygen level will be monitored until you leave the hospital or clinic.  Your heart rhythm will be watched to make sure it does not change.  You may have some redness on the skin where the shocks were given. Follow these instructions at home:  Do not drive for 24 hours if you were given a sedative during your procedure.  Take over-the-counter and prescription medicines only as told by your health care provider.  Ask your health care provider how to check your pulse. Check it often.  Rest for 48 hours after the procedure or as told by your health care provider.  Avoid or limit your caffeine use as told by your health care provider.  Keep all follow-up visits as told by your health care provider. This is important. Contact a health care provider if:  You feel like your heart is beating too quickly or your pulse is not regular.  You have a serious muscle cramp that does not go away. Get help right away if:  You have discomfort in your chest.  You are dizzy or you feel faint.  You have trouble breathing or you are short of breath.  Your speech is slurred.  You have trouble moving an arm or leg on one side of your body.  Your fingers or toes turn cold or blue. Summary  Electrical cardioversion is the delivery of a jolt of electricity to restore a normal rhythm to the heart.  This procedure may be done right away in an emergency or may be a scheduled procedure if the condition is not an emergency.  Generally, this is a safe procedure.  After the procedure, check your pulse often as told by your health care provider. This  information is not intended to replace advice given to you by your health care provider. Make sure you discuss any questions you have with your health care provider. Document Revised: 02/05/2019 Document Reviewed: 02/05/2019 Elsevier Patient Education  [place] After These instructions provide you with information about caring for yourself after your procedure. Your health care provider may also give you more specific instructions. Your treatment has been planned according to current medical practices, but problems sometimes occur. Call your health care provider if you have any problems or questions after your procedure. What can I expect after the procedure? After your procedure, you may:  Feel sleepy for several hours.  Feel clumsy and have poor balance for several hours.  Feel forgetful about what happened after the procedure.  Have poor judgment for  several hours.  Feel nauseous or vomit.  Have a sore throat if you had a breathing tube during the procedure. Follow these instructions at home: For at least 24 hours after the procedure:      Have a responsible adult stay with you. It is important to have someone help care for you until you are awake and alert.  Rest as needed.  Do not: ? Participate in activities in which you could fall or become injured. ? Drive. ? Use heavy machinery. ? Drink alcohol. ? Take sleeping pills or medicines that cause drowsiness. ? Make important decisions or sign legal documents. ? Take care of children on your own. Eating and drinking  Follow the diet that is recommended by your health care provider.  If you vomit, drink water, juice, or soup when you can drink without vomiting.  Make sure you have little or no nausea before eating solid foods. General instructions  Take over-the-counter and prescription medicines only as told by your health care provider.  If you have sleep apnea, surgery and  certain medicines can increase your risk for breathing problems. Follow instructions from your health care provider about wearing your sleep device: ? Anytime you are sleeping, including during daytime naps. ? While taking prescription pain medicines, sleeping medicines, or medicines that make you drowsy.  If you smoke, do not smoke without supervision.  Keep all follow-up visits as told by your health care provider. This is important. Contact a health care provider if:  You keep feeling nauseous or you keep vomiting.  You feel light-headed.  You develop a rash.  You have a fever. Get help right away if:  You have trouble breathing. Summary  For several hours after your procedure, you may feel sleepy and have poor judgment.  Have a responsible adult stay with you for at least 24 hours or until you are awake and alert. This information is not intended to replace advice given to you by your health care provider. Make sure you discuss any questions you have with your health care provider. Document Revised: 10/03/2017 Document Reviewed: 10/26/2015 Elsevier Patient Education  [place].

## 2020-04-09 ENCOUNTER — Telehealth: Payer: Self-pay

## 2020-04-09 LAB — SARS CORONAVIRUS 2 (TAT 6-24 HRS): SARS Coronavirus 2: NEGATIVE

## 2020-04-09 NOTE — Telephone Encounter (Signed)
The patient has been notified of the result and verbalized understanding.  All questions (if any) were answered. Wilma Flavin, RN 04/09/2020 9:43 AM

## 2020-04-09 NOTE — Telephone Encounter (Signed)
-----   Message from Fay Records, MD sent at 04/08/2020  8:33 PM EDT ----- Fluid number is improved from previous  Electrolytes and kidney function is normal  Keep on same meds

## 2020-04-10 ENCOUNTER — Other Ambulatory Visit: Payer: Self-pay

## 2020-04-10 ENCOUNTER — Ambulatory Visit (HOSPITAL_COMMUNITY): Payer: Medicare Other | Admitting: Anesthesiology

## 2020-04-10 ENCOUNTER — Ambulatory Visit (HOSPITAL_COMMUNITY)
Admission: RE | Admit: 2020-04-10 | Discharge: 2020-04-10 | Disposition: A | Discharge status: Home / Self Care | Payer: Medicare Other | Attending: Cardiology | Admitting: Cardiology

## 2020-04-10 ENCOUNTER — Encounter (HOSPITAL_COMMUNITY): Payer: Self-pay | Admitting: Cardiology

## 2020-04-10 ENCOUNTER — Encounter (HOSPITAL_COMMUNITY)
Admission: RE | Disposition: A | Discharge status: Home / Self Care | Payer: Self-pay | Source: Home / Self Care | Attending: Cardiology

## 2020-04-10 DIAGNOSIS — Z79899 Other long term (current) drug therapy: Secondary | ICD-10-CM | POA: Insufficient documentation

## 2020-04-10 DIAGNOSIS — Z7901 Long term (current) use of anticoagulants: Secondary | ICD-10-CM | POA: Insufficient documentation

## 2020-04-10 DIAGNOSIS — R000 Tachycardia, unspecified: Secondary | ICD-10-CM | POA: Diagnosis not present

## 2020-04-10 DIAGNOSIS — I4891 Unspecified atrial fibrillation: Secondary | ICD-10-CM | POA: Diagnosis not present

## 2020-04-10 DIAGNOSIS — I4819 Other persistent atrial fibrillation: Secondary | ICD-10-CM | POA: Diagnosis not present

## 2020-04-10 HISTORY — PX: CARDIOVERSION: SHX1299

## 2020-04-10 SURGERY — CARDIOVERSION
Anesthesia: General

## 2020-04-10 MED ORDER — LACTATED RINGERS IV SOLN: Freq: Once | INTRAVENOUS | Status: AC

## 2020-04-10 MED ORDER — MIDAZOLAM HCL 2 MG/2ML IJ SOLN
INTRAMUSCULAR | Status: AC
  Filled 2020-04-10: qty 2

## 2020-04-10 MED ORDER — MIDAZOLAM HCL 2 MG/2ML IJ SOLN
2.0000 mg | Freq: Once | INTRAMUSCULAR | Status: AC
  Administered 2020-04-10: 1 mg via INTRAVENOUS

## 2020-04-10 MED ORDER — LIDOCAINE 2% (20 MG/ML) 5 ML SYRINGE
INTRAMUSCULAR | Status: DC | PRN
  Administered 2020-04-10: 80 mg via INTRAVENOUS

## 2020-04-10 MED ORDER — PROPOFOL 10 MG/ML IV BOLUS
INTRAVENOUS | Status: DC | PRN
  Administered 2020-04-10: 70 mg via INTRAVENOUS

## 2020-04-10 MED ORDER — LACTATED RINGERS IV SOLN: INTRAVENOUS | Status: DC | PRN

## 2020-04-10 NOTE — H&P (Signed)
Patient presents for electrical cardioversion for afib. Please see the recent clinic note from Dr Harrington Challenger referenced below for full medical history. Has been on eliquis at home. Will plan for electrical cardioversion today with the assistance of anesthesia.   Carlyle Dolly MD  Cardiology Office Note   Date:  03/21/2020   ID:  Evan Ayala, DOB Jan 05, 1951, MRN [id number]  PCP:  Celene Squibb, MD           Cardiologist:   Dorris Carnes, MD   Pt is referred by Dr Juel Burrow office for evaluation of atrial fibrillation     History of Present Illness: Evan Ayala is a 69 y.o. male with a history ofHTN   He is followed by Merlyn Albert  The pt reports that about 6 wks ago he woke up and heard some whistling in his  lungs   He says that he was seen in Dr Durene Cal office  An Rx was given for antihistamine   The following week he had a fever   Rx with  a Z pac.   Pt then developed lung congestion   CXR showed cardiomegaly and vasc congestion.  Given Rx for prednisone and Lasix   After that he continued to have chest congestion.  Given Rx for Lasix    EKG done t found to be in afib   Meds changes    Talking to the pt he denies palpitations  Breathing is fair.   No CP   No dizziness      Active Medications      Current Meds  Medication Sig  . carvedilol (COREG) 3.125 MG tablet Take 3.125 mg by mouth 2 (two) times daily.  Marland Kitchen diltiazem (CARDIZEM CD) 120 MG 24 hr capsule Take 120 mg by mouth daily.  Marland Kitchen ELIQUIS 5 MG TABS tablet Take 5 mg by mouth 2 (two) times daily.  . furosemide (LASIX) 40 MG tablet Take 40 mg by mouth daily.  . potassium chloride (KLOR-CON) 10 MEQ tablet Take 10 mEq by mouth daily.  . rosuvastatin (CRESTOR) 10 MG tablet Take 10 mg by mouth daily.       Allergies:   Patient has no allergy information on record.   No past medical history on file.     Social History:  The patient     Family History:  The patient's family history is not on file.    ROS:  Please  see the history of present illness. All other systems are reviewed and  Negative to the above problem except as noted.    PHYSICAL EXAM: VS:  BP 122/80   Pulse (!) 129   Ht 6\' 1"  (1.854 m)   Wt 203 lb (92.1 kg)   SpO2 96%   BMI 26.78 kg/m   GEN: Well nourished, well developed, in no acute distress  HEENT: normal  Neck: no JVD, carotid bruits   Cardiac:  Irreg irrreg   No S3  No signif murmurs   No LE  edema  Respiratory:  clear to auscultation bilaterally, no rales or wheezes GI: soft, nontender, nondistended, + BS  No hepatomegaly  MS: no deformity Moving all extremities   Skin: warm and dry, no rash Neuro:  Strength and sensation are intact Psych: euthymic mood, full affect   EKG:  EKG is ordered today.  Atrial fibillation   129 bpm  LAD     Lipid Panel Labs (Brief)  No results found for: CHOL, TRIG, HDL, CHOLHDL, VLDL, LDLCALC,  LDLDIRECT         Wt Readings from Last 3 Encounters:  03/21/20 203 lb (92.1 kg)      ASSESSMENT AND PLAN:  1  Atrial fibrillation   Onset unclear   Currently rate is not controlled  CHADSVASc is 1     For now would d/c coreg  Increase atenolol to 25 bid   Follow HR and BP  Conitnue diltiazem Check TSH, BMET, BNP Check echo Plan for cardioversion in [facility] in September   2   HTN  BP is controlled  Follow on current meds   3   HCM  Will need to follow lipids      Current medicines are reviewed at length with the patient today.  The patient does not have concerns regarding medicines.  Signed, Dorris Carnes, MD  03/21/2020 2:24 PM    [facility] [place], [place],   [address] Phone: [phone]; Fax: [phone]

## 2020-04-10 NOTE — Anesthesia Preprocedure Evaluation (Addendum)
Anesthesia Evaluation  Patient identified by MRN, date of birth, ID band Patient awake    Reviewed: Allergy & Precautions, NPO status , Patient's Chart, lab work & pertinent test results  History of Anesthesia Complications Negative for: history of anesthetic complications  Airway Mallampati: II  TM Distance: >3 FB Neck ROM: Full    Dental  (+) Dental Advisory Given, Caps   Pulmonary neg pulmonary ROS,    Pulmonary exam normal breath sounds clear to auscultation       Cardiovascular Exercise Tolerance: Good + dysrhythmias Atrial Fibrillation  Rhythm:Irregular Rate:Tachycardia  1. Left ventricular ejection fraction, by estimation, is 30 to 35%. The  left ventricle has moderately decreased function. The left ventricle  demonstrates global hypokinesis. Left ventricular diastolic parameters are  indeterminate.  2. Right ventricular systolic function is normal. The right ventricular  size is normal. There is normal pulmonary artery systolic pressure.  3. Left atrial size was severely dilated.  4. Right atrial size was mildly dilated.  5. The mitral valve is normal in structure. Mild mitral valve  regurgitation. No evidence of mitral stenosis.  6. The aortic valve is tricuspid. There is mild calcification of the  aortic valve. There is mild thickening of the aortic valve. Aortic valve  regurgitation is trivial. No aortic stenosis is present.  7. The inferior vena cava is normal in size with greater than 50%  respiratory variability, suggesting right atrial pressure of 3 mmHg   Neuro/Psych negative neurological ROS  negative psych ROS   GI/Hepatic negative GI ROS, (+)     substance abuse  alcohol use,   Endo/Other  negative endocrine ROS  Renal/GU negative Renal ROS     Musculoskeletal negative musculoskeletal ROS (+)   Abdominal   Peds  Hematology negative hematology ROS (+)   Anesthesia Other Findings    Reproductive/Obstetrics negative OB ROS                          Anesthesia Physical Anesthesia Plan  ASA: IV  Anesthesia Plan: General   Post-op Pain Management:    Induction: Intravenous  PONV Risk Score and Plan: TIVA  Airway Management Planned: Nasal Cannula and Natural Airway  Additional Equipment:   Intra-op Plan:   Post-operative Plan:   Informed Consent: I have reviewed the patients History and Physical, chart, labs and discussed the procedure including the risks, benefits and alternatives for the proposed anesthesia with the patient or authorized representative who has indicated his/her understanding and acceptance.     Dental advisory given  Plan Discussed with: CRNA and Surgeon  Anesthesia Plan Comments:        Anesthesia Quick Evaluation

## 2020-04-10 NOTE — Anesthesia Procedure Notes (Signed)
Procedure Name: General with mask airway Date/Time: 04/10/2020 9:17 AM Performed by: Orlie Dakin, CRNA Pre-anesthesia Checklist: Patient identified, Emergency Drugs available, Suction available and Patient being monitored Patient Re-evaluated:Patient Re-evaluated prior to induction Oxygen Delivery Method: Non-rebreather mask Preoxygenation: Pre-oxygenation with 100% oxygen Induction Type: IV induction Placement Confirmation: positive ETCO2

## 2020-04-10 NOTE — CV Procedure (Signed)
Electrical Cardioversion Procedure Note KEILYN NADAL [id number] Feb 25, 1951  Procedure: Electrical Cardioversion Indications:Atrial fibrillation  Procedure Details Consent: Electrical cardioversion Time Out: Verified patient identification, verified procedure, site/side was marked, verified correct patient position, special equipment/implants available, medications/allergies/relevent history reviewed, required imaging and test results available.  time out performed:0919  Patient placed on cardiac monitor, pulse oximetry, supplemental oxygen as necessary.  Sedation given: per CRNA Pacer pads placed 0910  Cardioverted 1  time(s).  Cardioverted at [place].  Evaluation Findings: Post procedure EKG shows: SR Complications: None Patient {did tolerate procedure well.   Jon Gills Rivaan Kendall 04/10/2020, 9:48 AM

## 2020-04-10 NOTE — Anesthesia Postprocedure Evaluation (Signed)
Anesthesia Post Note  Patient: Evan Ayala  Procedure(s) Performed: CARDIOVERSION (N/A )  Patient location during evaluation: PACU Anesthesia Type: General Level of consciousness: awake and alert and oriented Pain management: pain level controlled Vital Signs Assessment: post-procedure vital signs reviewed and stable Respiratory status: spontaneous breathing Cardiovascular status: blood pressure returned to baseline and stable Postop Assessment: no headache and no apparent nausea or vomiting Anesthetic complications: no   No complications documented.   Last Vitals:  Vitals:   04/10/20 0937 04/10/20 0938  BP:    Pulse: 77 77  Resp: 15 15  Temp:    SpO2: 96% 97%    Last Pain:  Vitals:   04/10/20 0900  TempSrc:   PainSc: 0-No pain                 Orlie Dakin

## 2020-04-10 NOTE — Transfer of Care (Signed)
Immediate Anesthesia Transfer of Care Note  Patient: Evan Ayala  Procedure(s) Performed: CARDIOVERSION (N/A )  Patient Location: PACU  Anesthesia Type:General  Level of Consciousness: awake and patient cooperative  Airway & Oxygen Therapy: Patient Spontanous Breathing  Post-op Assessment: Report given to RN, Post -op Vital signs reviewed and stable and Patient moving all extremities X 4  Post vital signs: Reviewed and stable  Last Vitals:  Vitals Value Taken Time  BP    Temp    Pulse 68 04/10/20 0911  Resp 20 04/10/20 0911  SpO2 97 % 04/10/20 0911  Vitals shown include unvalidated device data.  Last Pain:  Vitals:   04/10/20 0900  TempSrc:   PainSc: 0-No pain         Complications: No complications documented.

## 2020-04-10 NOTE — Discharge Instructions (Signed)
Electrical Cardioversion Electrical cardioversion is the delivery of a jolt of electricity to restore a normal rhythm to the heart. A rhythm that is too fast or is not regular keeps the heart from pumping well. In this procedure, sticky patches or metal paddles are placed on the chest to deliver electricity to the heart from a device. This procedure may be done in an emergency if:  There is low or no blood pressure as a result of the heart rhythm.  Normal rhythm must be restored as fast as possible to protect the brain and heart from further damage.  It may save a life. This may also be a scheduled procedure for irregular or fast heart rhythms that are not immediately life-threatening. Tell a health care provider about:  Any allergies you have.  All medicines you are taking, including vitamins, herbs, eye drops, creams, and over-the-counter medicines.  Any problems you or family members have had with anesthetic medicines.  Any blood disorders you have.  Any surgeries you have had.  Any medical conditions you have.  Whether you are pregnant or may be pregnant. What are the risks? Generally, this is a safe procedure. However, problems may occur, including:  Allergic reactions to medicines.  A blood clot that breaks free and travels to other parts of your body.  The possible return of an abnormal heart rhythm within hours or days after the procedure.  Your heart stopping (cardiac arrest). This is rare. What happens before the procedure? Medicines  Your health care provider may have you start taking: ? Blood-thinning medicines (anticoagulants) so your blood does not clot as easily. ? Medicines to help stabilize your heart rate and rhythm.  Ask your health care provider about: ? Changing or stopping your regular medicines. This is especially important if you are taking diabetes medicines or blood thinners. ? Taking medicines such as aspirin and ibuprofen. These medicines can  thin your blood. Do not take these medicines unless your health care provider tells you to take them. ? Taking over-the-counter medicines, vitamins, herbs, and supplements. General instructions  Follow instructions from your health care provider about eating or drinking restrictions.  Plan to have someone take you home from the hospital or clinic.  If you will be going home right after the procedure, plan to have someone with you for 24 hours.  Ask your health care provider what steps will be taken to help prevent infection. These may include washing your skin with a germ-killing soap. What happens during the procedure?   An IV will be inserted into one of your veins.  Sticky patches (electrodes) or metal paddles may be placed on your chest.  You will be given a medicine to help you relax (sedative).  An electrical shock will be delivered. The procedure may vary among health care providers and hospitals. What can I expect after the procedure?  Your blood pressure, heart rate, breathing rate, and blood oxygen level will be monitored until you leave the hospital or clinic.  Your heart rhythm will be watched to make sure it does not change.  You may have some redness on the skin where the shocks were given. Follow these instructions at home:  Do not drive for 24 hours if you were given a sedative during your procedure.  Take over-the-counter and prescription medicines only as told by your health care provider.  Ask your health care provider how to check your pulse. Check it often.  Rest for 48 hours after the procedure or   as told by your health care provider.  Avoid or limit your caffeine use as told by your health care provider.  Keep all follow-up visits as told by your health care provider. This is important. Contact a health care provider if:  You feel like your heart is beating too quickly or your pulse is not regular.  You have a serious muscle cramp that does not go  away. Get help right away if:  You have discomfort in your chest.  You are dizzy or you feel faint.  You have trouble breathing or you are short of breath.  Your speech is slurred.  You have trouble moving an arm or leg on one side of your body.  Your fingers or toes turn cold or blue. Summary  Electrical cardioversion is the delivery of a jolt of electricity to restore a normal rhythm to the heart.  This procedure may be done right away in an emergency or may be a scheduled procedure if the condition is not an emergency.  Generally, this is a safe procedure.  After the procedure, check your pulse often as told by your health care provider. This information is not intended to replace advice given to you by your health care provider. Make sure you discuss any questions you have with your health care provider. Document Revised: 02/05/2019 Document Reviewed: 02/05/2019 Elsevier Patient Education  [place] After These instructions provide you with information about caring for yourself after your procedure. Your health care provider may also give you more specific instructions. Your treatment has been planned according to current medical practices, but problems sometimes occur. Call your health care provider if you have any problems or questions after your procedure. What can I expect after the procedure? After your procedure, you may:  Feel sleepy for several hours.  Feel clumsy and have poor balance for several hours.  Feel forgetful about what happened after the procedure.  Have poor judgment for several hours.  Feel nauseous or vomit.  Have a sore throat if you had a breathing tube during the procedure. Follow these instructions at home: For at least 24 hours after the procedure:      Have a responsible adult stay with you. It is important to have someone help care for you until you are awake and alert.  Rest as  needed.  Do not: ? Participate in activities in which you could fall or become injured. ? Drive. ? Use heavy machinery. ? Drink alcohol. ? Take sleeping pills or medicines that cause drowsiness. ? Make important decisions or sign legal documents. ? Take care of children on your own. Eating and drinking  Follow the diet that is recommended by your health care provider.  If you vomit, drink water, juice, or soup when you can drink without vomiting.  Make sure you have little or no nausea before eating solid foods. General instructions  Take over-the-counter and prescription medicines only as told by your health care provider.  If you have sleep apnea, surgery and certain medicines can increase your risk for breathing problems. Follow instructions from your health care provider about wearing your sleep device: ? Anytime you are sleeping, including during daytime naps. ? While taking prescription pain medicines, sleeping medicines, or medicines that make you drowsy.  If you smoke, do not smoke without supervision.  Keep all follow-up visits as told by your health care provider. This is important. Contact a health care provider if:  You keep  feeling nauseous or you keep vomiting.  You feel light-headed.  You develop a rash.  You have a fever. Get help right away if:  You have trouble breathing. Summary  For several hours after your procedure, you may feel sleepy and have poor judgment.  Have a responsible adult stay with you for at least 24 hours or until you are awake and alert. This information is not intended to replace advice given to you by your health care provider. Make sure you discuss any questions you have with your health care provider. Document Revised: 10/03/2017 Document Reviewed: 10/26/2015 Elsevier Patient Education  2020 [place].    Per Dr. Harl Bowie no medication changes continue medications as prescribed

## 2020-04-10 NOTE — CV Procedure (Signed)
CV Procedure Note  Procedure: electrical cardioversion Physician: Dr Carlyle Dolly MD Indication: peristent afib   Patient brought to the procedure suite after appropriate consent was obtained. Sedation was achieved with the assistance of anesthesiology, please refer to there note for full details. Defib pads placed in the anterior and posterior positions, succesfully converted to sinus rhythm with a single synchronized 200j shock. Cardiopulmonary monitoring performed throughout the proceudre, he tolerated well without complications   Carlyle Dolly MD

## 2020-04-15 ENCOUNTER — Encounter (HOSPITAL_COMMUNITY): Payer: Self-pay | Admitting: Cardiology

## 2020-04-21 ENCOUNTER — Other Ambulatory Visit: Payer: Self-pay

## 2020-04-21 ENCOUNTER — Telehealth: Payer: Self-pay | Admitting: Internal Medicine

## 2020-04-21 ENCOUNTER — Other Ambulatory Visit (INDEPENDENT_AMBULATORY_CARE_PROVIDER_SITE_OTHER): Payer: Medicare Other | Admitting: *Deleted

## 2020-04-21 VITALS — BP 122/76 | HR 110

## 2020-04-21 DIAGNOSIS — I4891 Unspecified atrial fibrillation: Secondary | ICD-10-CM

## 2020-04-21 NOTE — Telephone Encounter (Signed)
Pt notified that he can come in at 3 pm today. Pt voiced understanding.

## 2020-04-21 NOTE — Progress Notes (Unsigned)
Pt in office for EKG. States he is feeling fine.

## 2020-04-21 NOTE — Telephone Encounter (Signed)
Routing to [facility] triage to arrange EKG request, per Dr. Harrington Challenger.

## 2020-04-21 NOTE — Telephone Encounter (Signed)
Patient had cardioversion in September   NOw feels like his heart beat is irregular Would recomm EKG to confirm   He had rapid rates prior   Can EKG be done in [place]  I think this is most convenient for him?

## 2020-04-22 ENCOUNTER — Telehealth: Payer: Self-pay | Admitting: Internal Medicine

## 2020-04-22 DIAGNOSIS — I4891 Unspecified atrial fibrillation: Secondary | ICD-10-CM

## 2020-04-22 NOTE — Addendum Note (Signed)
Addended by: Loren Racer on: 04/22/2020 02:49 PM   Modules accepted: Orders

## 2020-04-22 NOTE — Telephone Encounter (Signed)
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  Keep me posted on when appt is.

## 2020-04-23 NOTE — Telephone Encounter (Signed)
Appointment with Dr. Curt Bears is 05/16/20.

## 2020-04-30 NOTE — Progress Notes (Signed)
Cardiology Office Note   Date:  05/01/2020   ID:  Evan Ayala, DOB 14-Apr-1951, MRN [id number]  PCP:  Celene Squibb, MD  Cardiologist:   Dorris Carnes, MD   Pt presents for f/u of atrial fibrillaton and CHF     History of Present Illness: Evan Ayala is a 69 y.o. male with a history ofHTN   He is followed by Merlyn Albert.  He complained of SOB and fatigue  Found to be in atrial fibrillaiton I saw him in clinic   Recomm an echo and probably cardioverion   Echo sowed LVEF down at 30 to 35%  RVEF normal   Felt possbly tachy induced   Plan for cardioversion .  This was done  In September   The pt says he felt good after   Breathing better  More energy   Then he says his breathing got short again     Called  Came in and EKG done  Back in afib     Current Meds  Medication Sig  . Ascorbic Acid (VITAMIN C PO) Take 1 capsule by mouth daily.  Marland Kitchen atenolol (TENORMIN) 50 MG tablet Take 50 mg by mouth 2 (two) times daily.  Marland Kitchen ELIQUIS 5 MG TABS tablet Take 5 mg by mouth 2 (two) times daily.  . furosemide (LASIX) 40 MG tablet Take 40 mg by mouth daily.  . Omega-3 Fatty Acids (FISH OIL PO) Take 1 capsule by mouth daily.  . potassium chloride (KLOR-CON) 10 MEQ tablet Take 10 mEq by mouth daily.  . rosuvastatin (CRESTOR) 10 MG tablet Take 10 mg by mouth at bedtime.   Marland Kitchen VITAMIN D PO Take 1 tablet by mouth daily.  Marland Kitchen zinc gluconate 50 MG tablet Take 50 mg by mouth daily.     Allergies:   Penicillins   Past Medical History:  Diagnosis Date  . Dysrhythmia    AFib  . Hypercholesteremia        Social History:  The patient  reports that he has never smoked. He has never used smokeless tobacco. He reports previous alcohol use. He reports that he does not use drugs.   Family History:  The patient's family history is not on file.    ROS:  Please see the history of present illness. All other systems are reviewed and  Negative to the above problem except as noted.    PHYSICAL EXAM: VS:  BP 114/84    Pulse (!) 102   Ht 6\' 1"  (1.854 m)   Wt 202 lb 12.8 oz (92 kg)   SpO2 96%   BMI 26.76 kg/m   GEN: Well nourished, well developed, in no acute distress  HEENT: normal  Neck: no JVD,    Cardiac:  Irreg irrreg   No S3  No signif murmurs   No LE  edema  Respiratory:  clear to auscultation bilaterally, no rales or wheezes GI: soft, nontender, nondistended, + BS  No hepatomegaly  MS: no deformity Moving all extremities   Skin: warm and dry, no rash Neuro:  Strength and sensation are intact Psych: euthymic mood, full affect   EKG:  EKG is ordered today.  Atrial fibillation   102 bpm   LAD  Nonspecific ST T Wave changes     Lipid Panel No results found for: CHOL, TRIG, HDL, CHOLHDL, VLDL, LDLCALC, LDLDIRECT    Wt Readings from Last 3 Encounters:  05/01/20 202 lb 12.8 oz (92 kg)  04/10/20 197 lb 1.5  oz (89.4 kg)  04/08/20 197 lb (89.4 kg)      ASSESSMENT AND PLAN:  1  Atrial fibrillation Pt was cardioverted and then went back into afib    I have spoken to [employer] and he is sched to see him next week to discuss possible ablation The pt is currently on atenolol 50 bid   He can increase to 62.5 / 50 daily    Follow HR   Keep on Eliquis    2  Chronic systolic CHF   Volume status is not bad   May be tachy induce   Plan to reeval LVEF aftter rhythm control  Check BMET and BNP   3   HTN  BP is OK  Continue meds    Use moloxicam cautiously, intermitt  For knees  Recomm Tylenol instead.     Current medicines are reviewed at length with the patient today.  The patient does not have concerns regarding medicines.  Signed, Dorris Carnes, MD  05/01/2020 2:02 PM    [facility] [place], [place], [place]  [address] Phone: [phone]; Fax: [phone]

## 2020-05-01 ENCOUNTER — Encounter: Payer: Self-pay | Admitting: Internal Medicine

## 2020-05-01 ENCOUNTER — Other Ambulatory Visit: Payer: Self-pay

## 2020-05-01 ENCOUNTER — Ambulatory Visit (INDEPENDENT_AMBULATORY_CARE_PROVIDER_SITE_OTHER): Payer: Medicare Other | Admitting: Internal Medicine

## 2020-05-01 VITALS — BP 114/84 | HR 102 | Ht 73.0 in | Wt 202.8 lb

## 2020-05-01 DIAGNOSIS — I4891 Unspecified atrial fibrillation: Secondary | ICD-10-CM | POA: Diagnosis not present

## 2020-05-01 DIAGNOSIS — R0602 Shortness of breath: Secondary | ICD-10-CM | POA: Diagnosis not present

## 2020-05-01 NOTE — Patient Instructions (Signed)
Medication Instructions:  No changes *If you need a refill on your cardiac medications before your next appointment, please call your pharmacy*   Lab Work: Today: bmet, bnp  If you have labs (blood work) drawn today and your tests are completely normal, you will receive your results only by:  [place] (if you have MyChart) OR  A paper copy in the mail If you have any lab test that is abnormal or we need to change your treatment, we will call you to review the results.   Testing/Procedures: none   Follow-Up: With Dr. Curt Bears as scheduled  Other Instructions

## 2020-05-02 LAB — BASIC METABOLIC PANEL
BUN/Creatinine Ratio: 21 (ref 10–24)
BUN: 22 mg/dL (ref 8–27)
CO2: 24 mmol/L (ref 20–29)
Calcium: 9.6 mg/dL (ref 8.6–10.2)
Chloride: 102 mmol/L (ref 96–106)
Creatinine, Ser: 1.06 mg/dL (ref 0.76–1.27)
GFR calc Af Amer: 83 mL/min/{1.73_m2} (ref >59)
GFR calc non Af Amer: 72 mL/min/{1.73_m2} (ref >59)
Glucose: 93 mg/dL (ref 65–99)
Potassium: 4.9 mmol/L (ref 3.5–5.2)
Sodium: 140 mmol/L (ref 134–144)

## 2020-05-02 LAB — PRO B NATRIURETIC PEPTIDE: NT-Pro BNP: 2950 pg/mL — ABNORMAL HIGH (ref 0–376)

## 2020-05-06 ENCOUNTER — Ambulatory Visit (INDEPENDENT_AMBULATORY_CARE_PROVIDER_SITE_OTHER): Payer: Medicare Other | Admitting: Cardiology

## 2020-05-06 ENCOUNTER — Encounter: Payer: Self-pay | Admitting: Cardiology

## 2020-05-06 ENCOUNTER — Other Ambulatory Visit: Payer: Self-pay

## 2020-05-06 VITALS — BP 104/68 | HR 82 | Ht 73.0 in | Wt 203.0 lb

## 2020-05-06 DIAGNOSIS — I4819 Other persistent atrial fibrillation: Secondary | ICD-10-CM

## 2020-05-06 DIAGNOSIS — Z01812 Encounter for preprocedural laboratory examination: Secondary | ICD-10-CM

## 2020-05-06 DIAGNOSIS — Z01810 Encounter for preprocedural cardiovascular examination: Secondary | ICD-10-CM

## 2020-05-06 MED ORDER — METOPROLOL TARTRATE 100 MG PO TABS: ORAL_TABLET | ORAL | 0 refills | Status: DC

## 2020-05-06 NOTE — Progress Notes (Signed)
Electrophysiology Office Note   Date:  05/06/2020   ID:  Evan Ayala, DOB 02-07-1951, MRN [id number]  PCP:  Celene Squibb, MD  Cardiologist: Harrington Challenger Primary Electrophysiologist:  Nychelle Cassata Meredith Leeds, MD    Chief Complaint: Atrial fibrillation   History of Present Illness: Evan Ayala is a 69 y.o. male who is being seen today for the evaluation of atrial fibrillation at the request of Fay Records, MD. Presenting today for electrophysiology evaluation.  He has a history significant for hypertension, hyperlipidemia, atrial fibrillation, and systolic heart failure.  His main complaints with atrial fibrillation are shortness of breath and fatigue.  His echo showed an ejection fraction of 30 to 35%.  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  He had a cardioversion performed September 2021, but unfortunately went back into atrial fibrillation.  He felt well after his cardioversion, but when he went back into atrial fibrillation his symptoms of shortness of breath and fatigue returned.  Today, he denies symptoms of palpitations, chest pain, shortness of breath, orthopnea, PND, lower extremity edema, claudication, dizziness, presyncope, syncope, bleeding, or neurologic sequela. The patient is tolerating medications without difficulties.  His atenolol dose was increased.  This is helped out with his shortness of breath.  He does continue to have episodes of fatigue.   Past Medical History:  Diagnosis Date  . Dysrhythmia    AFib  . Hypercholesteremia    Past Surgical History:  Procedure Laterality Date  . CARDIOVERSION N/A 04/10/2020   Procedure: CARDIOVERSION;  Surgeon: Arnoldo Lenis, MD;  Location: [facility];  Service: Endoscopy;  Laterality: N/A;  . HERNIA REPAIR Left   . TOTAL KNEE ARTHROPLASTY Left      Current Outpatient Medications  Medication Sig Dispense Refill  . Ascorbic Acid (VITAMIN C PO) Take 1 capsule by mouth daily.    Marland Kitchen  atenolol (TENORMIN) 50 MG tablet Take 2.5 tablets (125 mg)by mouth  in the AM and 2 tablets (100mg ) by mouth in the PM    . ELIQUIS 5 MG TABS tablet Take 5 mg by mouth 2 (two) times daily.    . furosemide (LASIX) 40 MG tablet Take 40 mg by mouth daily.    . Omega-3 Fatty Acids (FISH OIL PO) Take 1 capsule by mouth daily.    . potassium chloride (KLOR-CON) 10 MEQ tablet Take 10 mEq by mouth daily.    . rosuvastatin (CRESTOR) 10 MG tablet Take 10 mg by mouth at bedtime.     Marland Kitchen VITAMIN D PO Take 1 tablet by mouth daily.    Marland Kitchen zinc gluconate 50 MG tablet Take 50 mg by mouth daily.     No current facility-administered medications for this visit.    Allergies:   Penicillins   Social History:  The patient  reports that he has never smoked. He has never used smokeless tobacco. He reports current alcohol use. He reports that he does not use drugs.   Family History:  The patient's family history includes COPD in his father.    ROS:  Please see the history of present illness.   Otherwise, review of systems is positive for none.   All other systems are reviewed and negative.    PHYSICAL EXAM: VS:  BP 104/68   Pulse 82   Ht 6\' 1"  (1.854 m)   Wt 203 lb (92.1 kg)   SpO2 98%   BMI 26.78 kg/m  , BMI Body mass index is 26.78  kg/m. GEN: Well nourished, well developed, in no acute distress  HEENT: normal  Neck: no JVD, carotid bruits, or masses Cardiac: Irregular; no murmurs, rubs, or gallops,no edema  Respiratory:  clear to auscultation bilaterally, normal work of breathing GI: soft, nontender, nondistended, + BS MS: no deformity or atrophy  Skin: warm and dry Neuro:  Strength and sensation are intact Psych: euthymic mood, full affect  EKG:  EKG is not ordered today. Personal review of the ekg ordered 05/01/20 shows atrial fibrillation, rate 102  Recent Labs: 03/21/2020: TSH 2.690 04/08/2020: B Natriuretic Peptide 582.0; Hemoglobin 17.5; Platelets 209 05/01/2020: BUN 22; Creatinine, Ser 1.06;  NT-Pro BNP 2,950; Potassium 4.9; Sodium 140    Lipid Panel  No results found for: CHOL, TRIG, HDL, CHOLHDL, VLDL, LDLCALC, LDLDIRECT   Wt Readings from Last 3 Encounters:  05/06/20 203 lb (92.1 kg)  05/01/20 202 lb 12.8 oz (92 kg)  04/10/20 197 lb 1.5 oz (89.4 kg)      Other studies Reviewed: Additional studies/ records that were reviewed today include: TTE 03/26/20  Review of the above records today demonstrates:  1. Left ventricular ejection fraction, by estimation, is 30 to 35%. The  left ventricle has moderately decreased function. The left ventricle  demonstrates global hypokinesis. Left ventricular diastolic parameters are  indeterminate.  2. Right ventricular systolic function is normal. The right ventricular  size is normal. There is normal pulmonary artery systolic pressure.  3. Left atrial size was severely dilated.  4. Right atrial size was mildly dilated.  5. The mitral valve is normal in structure. Mild mitral valve  regurgitation. No evidence of mitral stenosis.  6. The aortic valve is tricuspid. There is mild calcification of the  aortic valve. There is mild thickening of the aortic valve. Aortic valve  regurgitation is trivial. No aortic stenosis is present.  7. The inferior vena cava is normal in size with greater than 50%  respiratory variability, suggesting right atrial pressure of 3 mmHg.    ASSESSMENT AND PLAN:  1.  Persistent atrial fibrillation: Currently on atenolol and Eliquis.  CHA2DS2-VASc of 2.  We discussed the possibility of further therapy with ablation or medication.  Due to the [place] AF trial, ablation would be likely a better option.  Risks and benefits were discussed which include bleeding, tamponade, heart block, stroke, damage to chest organs, among others.  The patient understands these risks and has agreed to the procedure.  2.  Hypertension: Currently well controlled  3.  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  Continue atenolol.  Case discussed with primary cardiology  Current medicines are reviewed at length with the patient today.   The patient does not have concerns regarding his medicines.  The following changes were made today:  none  Labs/ tests ordered today include:  Orders Placed This Encounter  Procedures  . CT CARDIAC MORPH/PULM VEIN W/CM&W/O CA SCORE  . Basic metabolic panel  . CBC     Disposition:   FU with Zasha Belleau 3 months  Signed, Larita Deremer Meredith Leeds, MD  05/06/2020 10:36 AM     [facility] [address] [place] [address] [phone] (office) [phone] (fax)

## 2020-05-06 NOTE — Patient Instructions (Addendum)
Medication Instructions:  Your physician recommends that you continue on your current medications as directed. Please refer to the Current Medication list given to you today.  *If you need a refill on your cardiac medications before your next appointment, please call your pharmacy*   Lab Work: None ordered If you have labs (blood work) drawn today and your tests are completely normal, you will receive your results only by: Marland Kitchen MyChart Message (if you have MyChart) OR . A paper copy in the mail If you have any lab test that is abnormal or we need to change your treatment, we will call you to review the results.   Testing/Procedures: None ordered   Follow-Up: At [facility], you and your health needs are our priority.  As part of our continuing mission to provide you with exceptional heart care, we have created designated Provider Care Teams.  These Care Teams include your primary Cardiologist (physician) and Advanced Practice Providers (APPs -  Physician Assistants and Nurse Practitioners) who all work together to provide you with the care you need, when you need it.  Your next appointment:   1 month(s)  after your ablation on 12/1  The format for your next appointment:   In Person  Provider:   You will follow up in the [facility] located at [facility]. Your provider will be: Roderic Palau, NP or Clint R. Fenton, PA-C    Thank you for choosing [facility]!!   Trinidad Curet, RN [phone]   Other Instructions CT INSTRUCTIONS Your cardiac CT will be scheduled at one of the below locations:   [facility] [address] [place], [place] [address] [phone]  [facility] [address] [place], [place] [address] [phone]  If scheduled at [facility], please arrive at the [facility] main entrance of [facility] 30 minutes prior to test start  time. Proceed to the [facility] Radiology Department (first floor) to check-in and test prep.  If scheduled at [facility], please arrive 15 mins early for check-in and test prep.  Please follow these instructions carefully (unless otherwise directed):  Hold all erectile dysfunction medications at least 3 days (72 hrs) prior to test.  On the Night Before the Test: . Be sure to Drink plenty of water. . Do not consume any caffeinated/decaffeinated beverages or chocolate 12 hours prior to your test. . Do not take any antihistamines 12 hours prior to your test.  On the Day of the Test: . Drink plenty of water. Do not drink any water within one hour of the test. . Do not eat any food 4 hours prior to the test. . You may take your regular medications prior to the test.  . Take metoprolol (Lopressor) 100 mg two hours prior to test.  You will HOLD your ATENOLOL on this day. Marland Kitchen HOLD Furosemide/Hydrochlorothiazide morning of the test.      After the Test: . Drink plenty of water. . After receiving IV contrast, you may experience a mild flushed feeling. This is normal. . On occasion, you may experience a mild rash up to 24 hours after the test. This is not dangerous. If this occurs, you can take Benadryl 25 mg and increase your fluid intake. . If you experience trouble breathing, this can be serious. If it is severe call 911 IMMEDIATELY. If it is mild, please call our office. . If you take any of  these medications: Glipizide/Metformin, Avandament, Glucavance, please do not take 48 hours after completing test unless otherwise instructed.   Once we have confirmed authorization from your insurance company, we will call you to set up a date and time for your test. Based on how quickly your insurance processes prior authorizations requests, please allow up to 4 weeks to be contacted for scheduling your Cardiac CT appointment. Be advised that routine Cardiac CT appointments could  be scheduled as many as 8 weeks after your provider has ordered it.  For non-scheduling related questions, please contact the cardiac imaging nurse navigator should you have any questions/concerns: Marchia Bond, Cardiac Imaging Nurse Navigator Burley Saver, Interim Cardiac Imaging Nurse [facility] Direct Office Dial: [phone]   For scheduling needs, including cancellations and rescheduling, please call Vivien Rota at [phone], option 3.       Electrophysiology/Ablation Procedure Instructions   You are scheduled for a(n)  ablation on 06/18/2020 with Dr. Allegra Lai.   1.   Pre procedure testing-             A.  LAB WORK --- On 06/02/2020 at the [facility] office (see address at the top of this letter) for your pre procedure blood work.  You do NOT need to be fasting.               B. COVID TEST-- On 06/16/2020 @ 9:00 am - This is a Drive Up Visit at [address] [place], [place] [address].  Someone will direct you to the appropriate testing line. Stay in your car and someone will be with you shortly.   After you are tested please go home and self quarantine until the day of your procedure.     2. On the day of your procedure 06/18/2020 you will go to [facility] [phone] [address]) at 9:30 am.  Dennis Bast will go to the main entrance A [employer]) and enter where the [employer] are.  Your driver will drop you off and you will head down the hallway to ADMITTING.  You may have one support person come in to the hospital with you.  They will be asked to wait in the waiting room.   3.   Do not eat or drink after midnight prior to your procedure.   4.   Do not miss any doses of your blood thinner prior to the morning of your procedure or your procedure will need to be rescheduled.       Do NOT take any medications the morning of your procedure.   5.  Plan for an overnight stay, but you may be discharged home after your procedure.    If you  use your phone frequently bring your phone charger, in case you have to stay.  If you are discharged after your procedure you will need someone to drive you home and be with your for 24 hours after your procedure.   6. You will follow up with the AFIB clinic 4 weeks after your procedure.  You will follow up with Dr. Curt Bears  3 months after your procedure.  These appointments will be made for you.   * If you have ANY questions please call the office [phone] and ask for Alexei Doswell RN or send me a MyChart message   * Occasionally, EP Studies and ablations can become lengthy.  Please make your family aware of this before your procedure starts.  Average time ranges  from 2-8 hours for EP studies/ablations.  Your physician will call your family after the procedure with the results.                                     Cardiac Ablation  Cardiac ablation is a procedure to stop some heart tissue from causing problems. The heart has many electrical connections. Sometimes these connections make the heart beat very fast or irregularly. Removing some problem areas can improve the heart rhythm or make it normal. What happens before the procedure?  Follow instructions from your doctor about what you cannot eat or drink.  Ask your doctor about: ? Changing or stopping your normal medicines. This is important if you take diabetes medicines or blood thinners. ? Taking medicines such as aspirin and ibuprofen. These medicines can thin your blood. Do not take these medicines before your procedure if your doctor tells you not to.  Plan to have someone take you home.  If you will be going home right after the procedure, plan to have someone with you for 24 hours. What happens during the procedure?  To lower your risk of infection: ? Your health care team will wash or sanitize their hands. ? Your skin will be washed with soap. ? Hair may be removed from your neck or groin.  An IV tube will be put into one of  your veins.  You will be given a medicine to help you relax (sedative).  Skin on your neck or groin will be numbed.  A cut (incision) will be made in your neck or groin.  A needle will be put through your cut and into a vein in your neck or groin.  A tube (catheter) will be put into the needle. The tube will be moved to your heart. X-rays (fluoroscopy) will be used to help guide the tube.  Small devices (electrodes) on the tip of the tube will send out electrical currents.  Dye may be put through the tube. This helps your surgeon see your heart.  Electrical energy will be used to scar (ablate) some heart tissue. Your surgeon may use: ? Heat (radiofrequency energy). ? Laser energy. ? Extreme cold (cryoablation).  The tube will be taken out.  Pressure will be held on your cut. This helps stop bleeding.  A bandage (dressing) will be put on your cut. The procedure may vary. What happens after the procedure?  You will be monitored until your medicines have worn off.  Your cut will be watched for bleeding. You will need to lie still for a few hours.  Do not drive for 24 hours or as long as your doctor tells you. Summary  Cardiac ablation is a procedure to stop some heart tissue from causing problems.  Electrical energy will be used to scar (ablate) some heart tissue. This information is not intended to replace advice given to you by your health care provider. Make sure you discuss  any questions you have with your health care provider. Document Revised: 06/17/2017 Document Reviewed: 05/24/2016 Elsevier Patient Education  2020 [employer].

## 2020-05-06 NOTE — Addendum Note (Signed)
Addended by: Stanton Kidney on: 05/06/2020 10:49 AM   Modules accepted: Orders

## 2020-05-08 MED ORDER — ATENOLOL 50 MG PO TABS
ORAL_TABLET | ORAL | 4 refills | Status: DC
Start: 2020-05-08 — End: 2020-05-08

## 2020-05-08 MED ORDER — ATENOLOL 25 MG PO TABS
ORAL_TABLET | ORAL | 1 refills | Status: DC
Start: 2020-05-08 — End: 2020-06-18

## 2020-05-12 DIAGNOSIS — Z23 Encounter for immunization: Secondary | ICD-10-CM | POA: Diagnosis not present

## 2020-05-20 DIAGNOSIS — R7301 Impaired fasting glucose: Secondary | ICD-10-CM | POA: Diagnosis not present

## 2020-05-20 DIAGNOSIS — L0390 Cellulitis, unspecified: Secondary | ICD-10-CM | POA: Diagnosis not present

## 2020-05-20 DIAGNOSIS — J069 Acute upper respiratory infection, unspecified: Secondary | ICD-10-CM | POA: Diagnosis not present

## 2020-05-20 DIAGNOSIS — H538 Other visual disturbances: Secondary | ICD-10-CM | POA: Diagnosis not present

## 2020-05-20 DIAGNOSIS — M25569 Pain in unspecified knee: Secondary | ICD-10-CM | POA: Diagnosis not present

## 2020-05-20 DIAGNOSIS — I10 Essential (primary) hypertension: Secondary | ICD-10-CM | POA: Diagnosis not present

## 2020-05-20 DIAGNOSIS — Z6829 Body mass index (BMI) 29.0-29.9, adult: Secondary | ICD-10-CM | POA: Diagnosis not present

## 2020-05-20 DIAGNOSIS — E782 Mixed hyperlipidemia: Secondary | ICD-10-CM | POA: Diagnosis not present

## 2020-05-20 DIAGNOSIS — I509 Heart failure, unspecified: Secondary | ICD-10-CM | POA: Diagnosis not present

## 2020-05-20 DIAGNOSIS — Z136 Encounter for screening for cardiovascular disorders: Secondary | ICD-10-CM | POA: Diagnosis not present

## 2020-05-20 DIAGNOSIS — E663 Overweight: Secondary | ICD-10-CM | POA: Diagnosis not present

## 2020-05-20 DIAGNOSIS — I480 Paroxysmal atrial fibrillation: Secondary | ICD-10-CM | POA: Diagnosis not present

## 2020-05-22 DIAGNOSIS — I5022 Chronic systolic (congestive) heart failure: Secondary | ICD-10-CM | POA: Diagnosis not present

## 2020-05-22 DIAGNOSIS — I480 Paroxysmal atrial fibrillation: Secondary | ICD-10-CM | POA: Diagnosis not present

## 2020-05-22 DIAGNOSIS — I10 Essential (primary) hypertension: Secondary | ICD-10-CM | POA: Diagnosis not present

## 2020-05-22 DIAGNOSIS — M1711 Unilateral primary osteoarthritis, right knee: Secondary | ICD-10-CM | POA: Diagnosis not present

## 2020-05-22 DIAGNOSIS — R7301 Impaired fasting glucose: Secondary | ICD-10-CM | POA: Diagnosis not present

## 2020-05-22 DIAGNOSIS — Z6829 Body mass index (BMI) 29.0-29.9, adult: Secondary | ICD-10-CM | POA: Diagnosis not present

## 2020-05-22 DIAGNOSIS — E782 Mixed hyperlipidemia: Secondary | ICD-10-CM | POA: Diagnosis not present

## 2020-05-26 DIAGNOSIS — Z23 Encounter for immunization: Secondary | ICD-10-CM | POA: Diagnosis not present

## 2020-06-02 ENCOUNTER — Other Ambulatory Visit: Payer: Self-pay

## 2020-06-02 ENCOUNTER — Other Ambulatory Visit: Payer: Medicare Other

## 2020-06-02 DIAGNOSIS — Z01812 Encounter for preprocedural laboratory examination: Secondary | ICD-10-CM | POA: Diagnosis not present

## 2020-06-02 DIAGNOSIS — I4819 Other persistent atrial fibrillation: Secondary | ICD-10-CM

## 2020-06-02 LAB — BASIC METABOLIC PANEL
BUN/Creatinine Ratio: 18 (ref 10–24)
BUN: 15 mg/dL (ref 8–27)
CO2: 22 mmol/L (ref 20–29)
Calcium: 9.9 mg/dL (ref 8.6–10.2)
Chloride: 99 mmol/L (ref 96–106)
Creatinine, Ser: 0.83 mg/dL (ref 0.76–1.27)
GFR calc Af Amer: 105 mL/min/{1.73_m2} (ref >59)
GFR calc non Af Amer: 90 mL/min/{1.73_m2} (ref >59)
Glucose: 96 mg/dL (ref 65–99)
Potassium: 4.4 mmol/L (ref 3.5–5.2)
Sodium: 139 mmol/L (ref 134–144)

## 2020-06-02 LAB — CBC
Hematocrit: 47.3 % (ref 37.5–51.0)
Hemoglobin: 16.3 g/dL (ref 13.0–17.7)
MCH: 30.2 pg (ref 26.6–33.0)
MCHC: 34.5 g/dL (ref 31.5–35.7)
MCV: 88 fL (ref 79–97)
Platelets: 219 10*3/uL (ref 150–450)
RBC: 5.4 x10E6/uL (ref 4.14–5.80)
RDW: 14.2 % (ref 11.6–15.4)
WBC: 8.1 10*3/uL (ref 3.4–10.8)

## 2020-06-09 ENCOUNTER — Telehealth (HOSPITAL_COMMUNITY): Payer: Self-pay | Admitting: Emergency Medicine

## 2020-06-09 NOTE — Telephone Encounter (Signed)
Attempted to call patient regarding upcoming cardiac CT appointment. °Left message on voicemail with name and callback number °Kalyse Meharg RN Navigator Cardiac Imaging °[facility] °[phone] Office °[phone] Cell ° °

## 2020-06-11 ENCOUNTER — Other Ambulatory Visit: Payer: Self-pay

## 2020-06-11 ENCOUNTER — Ambulatory Visit (HOSPITAL_COMMUNITY)
Admission: RE | Admit: 2020-06-11 | Discharge: 2020-06-11 | Disposition: A | Discharge status: Home / Self Care | Payer: Medicare Other | Source: Ambulatory Visit | Attending: Cardiology | Admitting: Cardiology

## 2020-06-11 DIAGNOSIS — I4819 Other persistent atrial fibrillation: Secondary | ICD-10-CM | POA: Insufficient documentation

## 2020-06-11 MED ORDER — IOHEXOL 350 MG/ML SOLN
80.0000 mL | Freq: Once | INTRAVENOUS | Status: AC | PRN
  Administered 2020-06-11: 80 mL via INTRAVENOUS

## 2020-06-16 ENCOUNTER — Other Ambulatory Visit (HOSPITAL_COMMUNITY)
Admission: RE | Admit: 2020-06-16 | Discharge: 2020-06-16 | Disposition: A | Discharge status: Home / Self Care | Payer: Medicare Other | Source: Ambulatory Visit | Attending: Cardiology | Admitting: Cardiology

## 2020-06-16 DIAGNOSIS — Z01812 Encounter for preprocedural laboratory examination: Secondary | ICD-10-CM | POA: Diagnosis not present

## 2020-06-16 DIAGNOSIS — Z20822 Contact with and (suspected) exposure to covid-19: Secondary | ICD-10-CM | POA: Diagnosis not present

## 2020-06-16 LAB — SARS CORONAVIRUS 2 (TAT 6-24 HRS): SARS Coronavirus 2: NEGATIVE

## 2020-06-17 NOTE — Progress Notes (Signed)
Instructed patient on the following items: °Arrival time 0930 °Nothing to eat or drink after midnight °No meds AM of procedure °Responsible person to drive you home and stay with you for 24 hrs ° °Have you missed any doses of anti-coagulant Eliquis- hasn't missed any doses °   °

## 2020-06-18 ENCOUNTER — Other Ambulatory Visit: Payer: Self-pay

## 2020-06-18 ENCOUNTER — Encounter (HOSPITAL_COMMUNITY)
Admission: RE | Disposition: A | Discharge status: Home / Self Care | Payer: Self-pay | Source: Home / Self Care | Attending: Cardiology

## 2020-06-18 ENCOUNTER — Ambulatory Visit (HOSPITAL_COMMUNITY): Payer: Medicare Other | Admitting: Anesthesiology

## 2020-06-18 ENCOUNTER — Ambulatory Visit (HOSPITAL_COMMUNITY)
Admission: RE | Admit: 2020-06-18 | Discharge: 2020-06-18 | Disposition: A | Discharge status: Home / Self Care | Payer: Medicare Other | Attending: Cardiology | Admitting: Cardiology

## 2020-06-18 DIAGNOSIS — I4891 Unspecified atrial fibrillation: Secondary | ICD-10-CM | POA: Diagnosis not present

## 2020-06-18 DIAGNOSIS — I4819 Other persistent atrial fibrillation: Secondary | ICD-10-CM | POA: Diagnosis not present

## 2020-06-18 DIAGNOSIS — E7800 Pure hypercholesterolemia, unspecified: Secondary | ICD-10-CM | POA: Diagnosis not present

## 2020-06-18 DIAGNOSIS — Z880 Allergy status to penicillin: Secondary | ICD-10-CM | POA: Insufficient documentation

## 2020-06-18 DIAGNOSIS — E785 Hyperlipidemia, unspecified: Secondary | ICD-10-CM | POA: Insufficient documentation

## 2020-06-18 DIAGNOSIS — I110 Hypertensive heart disease with heart failure: Secondary | ICD-10-CM | POA: Insufficient documentation

## 2020-06-18 DIAGNOSIS — I509 Heart failure, unspecified: Secondary | ICD-10-CM | POA: Diagnosis not present

## 2020-06-18 DIAGNOSIS — I5022 Chronic systolic (congestive) heart failure: Secondary | ICD-10-CM | POA: Insufficient documentation

## 2020-06-18 HISTORY — PX: ATRIAL FIBRILLATION ABLATION: EP1191

## 2020-06-18 LAB — POCT ACTIVATED CLOTTING TIME
Activated Clotting Time: 290 seconds
Activated Clotting Time: 301 seconds
Activated Clotting Time: 318 seconds

## 2020-06-18 SURGERY — ATRIAL FIBRILLATION ABLATION
Anesthesia: General

## 2020-06-18 MED ORDER — SODIUM CHLORIDE 0.9% FLUSH: 3.0000 mL | Freq: Two times a day (BID) | INTRAVENOUS | Status: DC

## 2020-06-18 MED ORDER — SODIUM CHLORIDE 0.9 % IV SOLN: 250.0000 mL | INTRAVENOUS | Status: DC | PRN

## 2020-06-18 MED ORDER — LIDOCAINE 2% (20 MG/ML) 5 ML SYRINGE
INTRAMUSCULAR | Status: DC | PRN
  Administered 2020-06-18: 80 mg via INTRAVENOUS

## 2020-06-18 MED ORDER — PROPOFOL 10 MG/ML IV BOLUS
INTRAVENOUS | Status: DC | PRN
  Administered 2020-06-18: 200 mg via INTRAVENOUS
  Administered 2020-06-18: 50 mg via INTRAVENOUS

## 2020-06-18 MED ORDER — PHENYLEPHRINE HCL-NACL 10-0.9 MG/250ML-% IV SOLN: INTRAVENOUS | Status: DC | PRN

## 2020-06-18 MED ORDER — PHENYLEPHRINE HCL-NACL 10-0.9 MG/250ML-% IV SOLN
INTRAVENOUS | Status: DC | PRN
  Administered 2020-06-18: 50 ug/min via INTRAVENOUS

## 2020-06-18 MED ORDER — ONDANSETRON HCL 4 MG/2ML IJ SOLN: 4.0000 mg | Freq: Four times a day (QID) | INTRAMUSCULAR | Status: DC | PRN

## 2020-06-18 MED ORDER — SUGAMMADEX SODIUM 200 MG/2ML IV SOLN
INTRAVENOUS | Status: DC | PRN
  Administered 2020-06-18 (×2): 100 mg via INTRAVENOUS

## 2020-06-18 MED ORDER — DEXMEDETOMIDINE (PRECEDEX) IN NS 20 MCG/5ML (4 MCG/ML) IV SYRINGE
PREFILLED_SYRINGE | INTRAVENOUS | Status: DC | PRN
  Administered 2020-06-18 (×2): 8 ug via INTRAVENOUS

## 2020-06-18 MED ORDER — HEPARIN SODIUM (PORCINE) 1000 UNIT/ML IJ SOLN
INTRAMUSCULAR | Status: DC | PRN
  Administered 2020-06-18: 2000 [IU] via INTRAVENOUS
  Administered 2020-06-18: 3000 [IU] via INTRAVENOUS
  Administered 2020-06-18: 4000 [IU] via INTRAVENOUS
  Administered 2020-06-18: 15000 [IU] via INTRAVENOUS

## 2020-06-18 MED ORDER — HEPARIN (PORCINE) IN NACL 1000-0.9 UT/500ML-% IV SOLN
INTRAVENOUS | Status: AC
  Filled 2020-06-18: qty 2500

## 2020-06-18 MED ORDER — DEXAMETHASONE SODIUM PHOSPHATE 10 MG/ML IJ SOLN
INTRAMUSCULAR | Status: DC | PRN
  Administered 2020-06-18: 5 mg via INTRAVENOUS

## 2020-06-18 MED ORDER — EPHEDRINE SULFATE-NACL 50-0.9 MG/10ML-% IV SOSY: PREFILLED_SYRINGE | INTRAVENOUS | Status: DC | PRN

## 2020-06-18 MED ORDER — ROCURONIUM BROMIDE 10 MG/ML (PF) SYRINGE
PREFILLED_SYRINGE | INTRAVENOUS | Status: DC | PRN
  Administered 2020-06-18: 20 mg via INTRAVENOUS
  Administered 2020-06-18: 60 mg via INTRAVENOUS

## 2020-06-18 MED ORDER — HEPARIN SODIUM (PORCINE) 1000 UNIT/ML IJ SOLN
INTRAMUSCULAR | Status: DC | PRN
  Administered 2020-06-18: 1000 [IU] via INTRAVENOUS

## 2020-06-18 MED ORDER — ONDANSETRON HCL 4 MG/2ML IJ SOLN
INTRAMUSCULAR | Status: DC | PRN
  Administered 2020-06-18: 4 mg via INTRAVENOUS

## 2020-06-18 MED ORDER — PROTAMINE SULFATE 10 MG/ML IV SOLN
INTRAVENOUS | Status: DC | PRN
  Administered 2020-06-18 (×2): 20 mg via INTRAVENOUS

## 2020-06-18 MED ORDER — ACETAMINOPHEN 325 MG PO TABS: 650.0000 mg | ORAL_TABLET | ORAL | Status: DC | PRN

## 2020-06-18 MED ORDER — METOPROLOL SUCCINATE ER 50 MG PO TB24: 50.0000 mg | ORAL_TABLET | Freq: Every day | ORAL | 3 refills | Status: DC

## 2020-06-18 MED ORDER — MIDAZOLAM HCL 2 MG/2ML IJ SOLN
INTRAMUSCULAR | Status: DC | PRN
  Administered 2020-06-18: 2 mg via INTRAVENOUS

## 2020-06-18 MED ORDER — SODIUM CHLORIDE 0.9% FLUSH: 3.0000 mL | INTRAVENOUS | Status: DC | PRN

## 2020-06-18 MED ORDER — HEPARIN SODIUM (PORCINE) 1000 UNIT/ML IJ SOLN
INTRAMUSCULAR | Status: AC
  Filled 2020-06-18: qty 1

## 2020-06-18 MED ORDER — HEPARIN (PORCINE) IN NACL 1000-0.9 UT/500ML-% IV SOLN
INTRAVENOUS | Status: DC | PRN
  Administered 2020-06-18 (×5): 500 mL

## 2020-06-18 MED ORDER — FENTANYL CITRATE (PF) 250 MCG/5ML IJ SOLN
INTRAMUSCULAR | Status: DC | PRN
  Administered 2020-06-18 (×3): 50 ug via INTRAVENOUS

## 2020-06-18 MED ORDER — SUCCINYLCHOLINE CHLORIDE 200 MG/10ML IV SOSY: PREFILLED_SYRINGE | INTRAVENOUS | Status: DC | PRN

## 2020-06-18 MED ORDER — PHENYLEPHRINE 40 MCG/ML (10ML) SYRINGE FOR IV PUSH (FOR BLOOD PRESSURE SUPPORT)
PREFILLED_SYRINGE | INTRAVENOUS | Status: DC | PRN
  Administered 2020-06-18: 80 ug via INTRAVENOUS
  Administered 2020-06-18 (×3): 120 ug via INTRAVENOUS

## 2020-06-18 MED ORDER — SODIUM CHLORIDE 0.9 % IV SOLN: INTRAVENOUS | Status: DC

## 2020-06-18 SURGICAL SUPPLY — 22 items
BAG SNAP BAND KOVER 36X36 (MISCELLANEOUS) ×2 IMPLANT
BLANKET WARM UNDERBOD FULL ACC (MISCELLANEOUS) ×2 IMPLANT
CATH 8FR REPROCESSED SOUNDSTAR (CATHETERS) ×2 IMPLANT
CATH MAPPNG PENTARAY F 2-6-2MM (CATHETERS) ×1 IMPLANT
CATH S CIRCA THERM PROBE 10F (CATHETERS) ×2 IMPLANT
CATH SMTCH THERMOCOOL SF DF (CATHETERS) ×2 IMPLANT
CATH WEBSTER BI DIR CS D-F CRV (CATHETERS) ×2 IMPLANT
CLOSURE PERCLOSE PROSTYLE (VASCULAR PRODUCTS) ×8 IMPLANT
COVER SWIFTLINK CONNECTOR (BAG) ×2 IMPLANT
KIT VERSACROSS STEERABLE D1 (CATHETERS) ×2 IMPLANT
MAT PREVALON FULL STRYKER (MISCELLANEOUS) ×2 IMPLANT
PACK EP LATEX FREE (CUSTOM PROCEDURE TRAY) ×1
PACK EP LF (CUSTOM PROCEDURE TRAY) ×1 IMPLANT
PAD PRO RADIOLUCENT 2001M-C (PAD) ×2 IMPLANT
PATCH CARTO3 (PAD) ×2 IMPLANT
PENTARAY F 2-6-2MM (CATHETERS) ×2
SHEATH CARTO VIZIGO SM CVD (SHEATH) ×2 IMPLANT
SHEATH PINNACLE 7F 10CM (SHEATH) ×2 IMPLANT
SHEATH PINNACLE 8F 10CM (SHEATH) ×4 IMPLANT
SHEATH PINNACLE 9F 10CM (SHEATH) ×2 IMPLANT
SHEATH PROBE COVER 6X72 (BAG) ×2 IMPLANT
TUBING SMART ABLATE COOLFLOW (TUBING) ×2 IMPLANT

## 2020-06-18 NOTE — Anesthesia Procedure Notes (Addendum)
Procedure Name: Intubation Date/Time: 06/18/2020 11:37 AM Performed by: Rande Brunt, CRNA Pre-anesthesia Checklist: Patient identified, Emergency Drugs available, Suction available and Patient being monitored Patient Re-evaluated:Patient Re-evaluated prior to induction Oxygen Delivery Method: Circle System Utilized Preoxygenation: Pre-oxygenation with 100% oxygen Induction Type: IV induction Ventilation: Mask ventilation without difficulty Laryngoscope Size: Mac and 4 Grade View: Grade II Tube type: Oral Number of attempts: 1 Airway Equipment and Method: Stylet Placement Confirmation: ETT inserted through vocal cords under direct vision,  positive ETCO2 and breath sounds checked- equal and bilateral Secured at: 23 cm Tube secured with: Tape Dental Injury: Teeth and Oropharynx as per pre-operative assessment

## 2020-06-18 NOTE — Progress Notes (Signed)
Up and walked and tolerated well; bilat groins stable, no bleeding or hematoma 

## 2020-06-18 NOTE — Discharge Instructions (Signed)
Cardiac Ablation, Care After This sheet gives you information about how to care for yourself after your procedure. Your health care provider may also give you more specific instructions. If you have problems or questions, contact your health care provider. What can I expect after the procedure? After the procedure, it is common to have:  Bruising around your puncture site.  Tenderness around your puncture site.  Skipped heartbeats.  Tiredness (fatigue). Follow these instructions at home: Puncture site care   Follow instructions from your health care provider about how to take care of your puncture site. Make sure you: ? Wash your hands with soap and water before you change your bandage (dressing). If soap and water are not available, use hand sanitizer. ? Change your dressing as told by your health care provider. ? Leave stitches (sutures), skin glue, or adhesive strips in place. These skin closures may need to stay in place for up to 2 weeks. If adhesive strip edges start to loosen and curl up, you may trim the loose edges. Do not remove adhesive strips completely unless your health care provider tells you to do that.  Check your puncture site every day for signs of infection. Check for: ? Redness, swelling, or pain. ? Fluid or blood. If your puncture site starts to bleed, lie down on your back, apply firm pressure to the area, and contact your health care provider. ? Warmth. ? Pus or a bad smell. Driving  Ask your health care provider when it is safe for you to drive again after the procedure.  Do not drive or use heavy machinery while taking prescription pain medicine.  Do not drive for 24 hours if you were given a medicine to help you relax (sedative) during your procedure. Activity  Avoid activities that take a lot of effort for at least 3 days after your procedure.  Do not lift anything that is heavier than 10 lb (4.5 kg), or the limit that you are told, until your health  care provider says that it is safe.  Return to your normal activities as told by your health care provider. Ask your health care provider what activities are safe for you. General instructions  Take over-the-counter and prescription medicines only as told by your health care provider.  Do not use any products that contain nicotine or tobacco, such as cigarettes and e-cigarettes. If you need help quitting, ask your health care provider.  Do not take baths, swim, or use a hot tub until your health care provider approves.  Do not drink alcohol for 24 hours after your procedure.  Keep all follow-up visits as told by your health care provider. This is important. Contact a health care provider if:  You have redness, mild swelling, or pain around your puncture site.  You have fluid or blood coming from your puncture site that stops after applying firm pressure to the area.  Your puncture site feels warm to the touch.  You have pus or a bad smell coming from your puncture site.  You have a fever.  You have chest pain or discomfort that spreads to your neck, jaw, or arm.  You are sweating a lot.  You feel nauseous.  You have a fast or irregular heartbeat.  You have shortness of breath.  You are dizzy or light-headed and feel the need to lie down.  You have pain or numbness in the arm or leg closest to your puncture site. Get help right away if:  Your puncture   site suddenly swells.  Your puncture site is bleeding and the bleeding does not stop after applying firm pressure to the area. These symptoms may represent a serious problem that is an emergency. Do not wait to see if the symptoms will go away. Get medical help right away. Call your local emergency services (911 in the [place] Do not drive yourself to the hospital. Summary  After the procedure, it is normal to have bruising and tenderness at the puncture site in your groin, neck, or forearm.  Check your puncture site every  day for signs of infection.  Get help right away if your puncture site is bleeding and the bleeding does not stop after applying firm pressure to the area. This is a medical emergency. This information is not intended to replace advice given to you by your health care provider. Make sure you discuss any questions you have with your health care provider. Document Revised: 06/17/2017 Document Reviewed: 10/14/2016 Elsevier Patient Education  [place] procedure care instructions No driving for 4 days. No lifting over 5 lbs for 1 week. No vigorous or sexual activity for 1 week. You may return to work/your usual activities on 06/25/2020. Keep procedure site clean & dry. If you notice increased pain, swelling, bleeding or pus, call/return!  You may shower after 24 hours, but no soaking in baths/hot tubs/pools for 1 week.     You have an appointment set up with the [facility].  Multiple studies have shown that being followed by a dedicated atrial fibrillation clinic in addition to the standard care you receive from your other physicians improves health. We believe that enrollment in the atrial fibrillation clinic will allow [place] to better care for you.   The phone number to the [facility] is [phone]. The clinic is staffed Monday through Friday from 8:30am to 5pm.  Parking Directions: The clinic is located in the Heart and Vascular Building connected to [facility]. 1)From [address] turn on to [address] and go to the 3rd entrance  (Heart and Vascular entrance) on the right. 2)Look to the right for Heart &Vascular Parking Garage. 3)A code for the entrance is required, for December is 3007.   4)Take the elevators to the 1st floor. Registration is in the room with the glass walls at the end of the hallway.  If you have any trouble parking or locating the clinic, please don't hesitate to call [phone].

## 2020-06-18 NOTE — H&P (Signed)
Electrophysiology Office Note   Date:  06/18/2020   ID:  Evan Ayala, DOB 04/06/1951, MRN [id number]  PCP:  Celene Squibb, MD  Cardiologist: Harrington Challenger Primary Electrophysiologist:  Luvada Salamone Meredith Leeds, MD    Chief Complaint: Atrial fibrillation   History of Present Illness: Evan Ayala is a 69 y.o. male who is being seen today for the evaluation of atrial fibrillation at the request of No ref. provider found. Presenting today for electrophysiology evaluation.  He has a history significant for hypertension, hyperlipidemia, atrial fibrillation, and systolic heart failure.  His main complaints with atrial fibrillation are shortness of breath and fatigue.  His echo showed an ejection fraction of 30 to 35%.  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  He had a cardioversion performed September 2021, but unfortunately went back into atrial fibrillation.  He felt well after his cardioversion, but when he went back into atrial fibrillation his symptoms of shortness of breath and fatigue returned.  Today, denies symptoms of palpitations, chest pain, shortness of breath, orthopnea, PND, lower extremity edema, claudication, dizziness, presyncope, syncope, bleeding, or neurologic sequela. The patient is tolerating medications without difficulties. Plan for AF ablation today.    Past Medical History:  Diagnosis Date  . Dysrhythmia    AFib  . Hypercholesteremia    Past Surgical History:  Procedure Laterality Date  . CARDIOVERSION N/A 04/10/2020   Procedure: CARDIOVERSION;  Surgeon: Arnoldo Lenis, MD;  Location: [facility];  Service: Endoscopy;  Laterality: N/A;  . HERNIA REPAIR Left   . TOTAL KNEE ARTHROPLASTY Left      Current Facility-Administered Medications  Medication Dose Route Frequency Provider Last Rate Last Admin  . 0.9 %  sodium chloride infusion   Intravenous Continuous Constance Haw, MD 50 mL/hr at 06/18/20 1023 New Bag at  06/18/20 1023    Allergies:   Penicillins   Social History:  The patient  reports that he has never smoked. He has never used smokeless tobacco. He reports current alcohol use. He reports that he does not use drugs.   Family History:  The patient's family history includes COPD in his father.    ROS:  Please see the history of present illness.   Otherwise, review of systems is positive for none.   All other systems are reviewed and negative.   PHYSICAL EXAM: VS:  BP (!) 152/94   Pulse 74   Temp 98.2 F (36.8 C) (Oral)   Ht 6\' 1"  (1.854 m)   Wt 88 kg   SpO2 99%   BMI 25.60 kg/m  , BMI Body mass index is 25.6 kg/m. GEN: Well nourished, well developed, in no acute distress  HEENT: normal  Neck: no JVD, carotid bruits, or masses Cardiac: iRRR; no murmurs, rubs, or gallops,no edema  Respiratory:  clear to auscultation bilaterally, normal work of breathing GI: soft, nontender, nondistended, + BS MS: no deformity or atrophy  Skin: warm and dry Neuro:  Strength and sensation are intact Psych: euthymic mood, full affect   Recent Labs: 03/21/2020: TSH 2.690 04/08/2020: B Natriuretic Peptide 582.0 05/01/2020: NT-Pro BNP 2,950 06/02/2020: BUN 15; Creatinine, Ser 0.83; Hemoglobin 16.3; Platelets 219; Potassium 4.4; Sodium 139    Lipid Panel  No results found for: CHOL, TRIG, HDL, CHOLHDL, VLDL, LDLCALC, LDLDIRECT   Wt Readings from Last 3 Encounters:  06/18/20 88 kg  05/06/20 92.1 kg  05/01/20 92 kg      Other studies Reviewed: Additional studies/  records that were reviewed today include: TTE 03/26/20  Review of the above records today demonstrates:  1. Left ventricular ejection fraction, by estimation, is 30 to 35%. The  left ventricle has moderately decreased function. The left ventricle  demonstrates global hypokinesis. Left ventricular diastolic parameters are  indeterminate.  2. Right ventricular systolic function is normal. The right ventricular  size is normal.  There is normal pulmonary artery systolic pressure.  3. Left atrial size was severely dilated.  4. Right atrial size was mildly dilated.  5. The mitral valve is normal in structure. Mild mitral valve  regurgitation. No evidence of mitral stenosis.  6. The aortic valve is tricuspid. There is mild calcification of the  aortic valve. There is mild thickening of the aortic valve. Aortic valve  regurgitation is trivial. No aortic stenosis is present.  7. The inferior vena cava is normal in size with greater than 50%  respiratory variability, suggesting right atrial pressure of 3 mmHg.    ASSESSMENT AND PLAN:  1.  Persistent atrial fibrillation: Evan Ayala has presented today for surgery, with the diagnosis of atrial fibrillation.  The various methods of treatment have been discussed with the patient and family. After consideration of risks, benefits and other options for treatment, the patient has consented to  Procedure(s): Catheter ablation as a surgical intervention .  Risks include but not limited to complete heart block, stroke, esophageal damage, nerve damage, bleeding, vascular damage, tamponade, perforation, MI, and death. The patient's history has been reviewed, patient examined, no change in status, stable for surgery.  I have reviewed the patient's chart and labs.  Questions were answered to the patient's satisfaction.    Vijay Durflinger Curt Bears, MD 06/18/2020 10:25 AM

## 2020-06-18 NOTE — Transfer of Care (Signed)
Immediate Anesthesia Transfer of Care Note  Patient: Evan Ayala  Procedure(s) Performed: ATRIAL FIBRILLATION ABLATION (N/A )  Patient Location: Cath Lab  Anesthesia Type:General  Level of Consciousness: awake, alert  and drowsy  Airway & Oxygen Therapy: Patient Spontanous Breathing and Patient connected to face mask oxygen  Post-op Assessment: Report given to RN, Post -op Vital signs reviewed and stable and Patient moving all extremities  Post vital signs: Reviewed and stable  Last Vitals:  Vitals Value Taken Time  BP 107/70 06/18/20 1427  Temp    Pulse 65   Resp 12 06/18/20 1430  SpO2 96   Vitals shown include unvalidated device data.  Last Pain:  Vitals:   06/18/20 0909  TempSrc: Oral         Complications: No complications documented.

## 2020-06-18 NOTE — Anesthesia Preprocedure Evaluation (Addendum)
Anesthesia Evaluation  Patient identified by MRN, date of birth, ID band Patient awake    Reviewed: Patient's Chart, lab work & pertinent test results, reviewed documented beta blocker date and time   Airway Mallampati: II  TM Distance: >3 FB Neck ROM: Full    Dental  (+) Teeth Intact   Pulmonary neg pulmonary ROS,    Pulmonary exam normal        Cardiovascular hypertension, Pt. on medications and Pt. on home beta blockers +CHF  + dysrhythmias Atrial Fibrillation  Rhythm:Irregular Rate:Normal     Neuro/Psych negative neurological ROS  negative psych ROS   GI/Hepatic negative GI ROS, Neg liver ROS,   Endo/Other  negative endocrine ROS  Renal/GU negative Renal ROS     Musculoskeletal negative musculoskeletal ROS (+)   Abdominal (+)  Abdomen: soft. Bowel sounds: normal.  Peds  Hematology negative hematology ROS (+)   Anesthesia Other Findings   Reproductive/Obstetrics negative OB ROS                            Anesthesia Physical Anesthesia Plan  ASA: III  Anesthesia Plan: General   Post-op Pain Management:    Induction: Intravenous  PONV Risk Score and Plan: 2 and Ondansetron, Dexamethasone and Treatment may vary due to age or medical condition  Airway Management Planned: Mask and Oral ETT  Additional Equipment: None  Intra-op Plan:   Post-operative Plan: Extubation in OR  Informed Consent: I have reviewed the patients History and Physical, chart, labs and discussed the procedure including the risks, benefits and alternatives for the proposed anesthesia with the patient or authorized representative who has indicated his/her understanding and acceptance.     Dental advisory given  Plan Discussed with: CRNA  Anesthesia Plan Comments: (Lab Results      Component                Value               Date                      WBC                      8.1                  06/02/2020                HGB                      16.3                06/02/2020                HCT                      47.3                06/02/2020                MCV                      88                  06/02/2020                PLT  219                 06/02/2020           ECHO 09/21: 1. Left ventricular ejection fraction, by estimation, is 30 to 35%. The  left ventricle has moderately decreased function. The left ventricle  demonstrates global hypokinesis. Left ventricular diastolic parameters are  indeterminate.  2. Right ventricular systolic function is normal. The right ventricular  size is normal. There is normal pulmonary artery systolic pressure.  3. Left atrial size was severely dilated.  4. Right atrial size was mildly dilated.  5. The mitral valve is normal in structure. Mild mitral valve  regurgitation. No evidence of mitral stenosis.  6. The aortic valve is tricuspid. There is mild calcification of the  aortic valve. There is mild thickening of the aortic valve. Aortic valve  regurgitation is trivial. No aortic stenosis is present.  7. The inferior vena cava is normal in size with greater than 50%  respiratory variability, suggesting right atrial pressure of 3 mmHg. )       Anesthesia Quick Evaluation

## 2020-06-19 ENCOUNTER — Encounter (HOSPITAL_COMMUNITY): Payer: Self-pay | Admitting: Cardiology

## 2020-06-19 NOTE — Anesthesia Postprocedure Evaluation (Signed)
Anesthesia Post Note  Patient: Evan Ayala  Procedure(s) Performed: ATRIAL FIBRILLATION ABLATION (N/A )     Patient location during evaluation: PACU Anesthesia Type: General Level of consciousness: awake and alert Pain management: pain level controlled Vital Signs Assessment: post-procedure vital signs reviewed and stable Respiratory status: spontaneous breathing, nonlabored ventilation, respiratory function stable and patient connected to nasal cannula oxygen Cardiovascular status: blood pressure returned to baseline and stable Postop Assessment: no apparent nausea or vomiting Anesthetic complications: no   No complications documented.  Last Vitals:  Vitals:   06/18/20 1730 06/18/20 1800  BP: 108/70 125/82  Pulse: 86 86  Resp: 18 16  Temp:    SpO2: 95% 95%    Last Pain:  Vitals:   06/18/20 0909  TempSrc: Oral                 [name]

## 2020-06-20 MED ORDER — ESOMEPRAZOLE MAGNESIUM 40 MG PO CPDR: DELAYED_RELEASE_CAPSULE | ORAL | 0 refills | Status: DC

## 2020-06-20 MED ORDER — COLCHICINE 0.6 MG PO TABS: 0.6000 mg | ORAL_TABLET | Freq: Every day | ORAL | 0 refills | Status: DC

## 2020-06-20 NOTE — Telephone Encounter (Signed)
Reports symptoms started last night.  He could not get comfortable in the bed and ended up sleeping in the recliner. Denies SOB, edema/weight gain, radiating CP/discomfort, light headedness/syncope. Aware I will discuss w/ Dr. Curt Bears and let him know recommendation. Pt agreeable to plan.

## 2020-06-20 NOTE — Telephone Encounter (Signed)
Per Dr. Curt Bears: Pt advised to start Colchicine 0.6 mg once daily x 10 days, along w/ Nexium 40 mg once daily x 10 days. Advised to call office back/go to ED if worsens (understands improvement may take a day/two). Patient verbalized understanding and agreeable to plan.

## 2020-07-15 NOTE — Progress Notes (Signed)
Primary Care Physician: Celene Squibb, MD Primary Cardiologist: Dr Harrington Challenger Primary Electrophysiologist: Dr Curt Bears Referring Physician: Dr Claria Dice is a 69 y.o. male with a history of HTN, HLD, chronic systolic CHF, and atrial fibrillation who presents for follow up in the [facility].  The patient was initially diagnosed with atrial fibrillation 03/2020. Echo 03/26/20 showed EF 30-35%. He underwent DCCV on 04/10/20 but unfortunately was back in afib on follow up 10/14. Patient is on Eliquis for a CHADS2VASC score of 3. He underwent afib ablation with Dr Curt Bears on 06/18/20. He reports that he has has some episodes of afib post ablation, mostly within the first 1-2 weeks post ablation. He has been mostly in SR over the last week. He denies any CP, swallowing, or groin issues.   Today, he denies symptoms of chest pain, shortness of breath, orthopnea, PND, lower extremity edema, dizziness, presyncope, syncope, snoring, daytime somnolence, bleeding, or neurologic sequela. The patient is tolerating medications without difficulties and is otherwise without complaint today.    Atrial Fibrillation Risk Factors:  he does not have symptoms or diagnosis of sleep apnea. he does not have a history of rheumatic fever.   he has a BMI of Body mass index is 25.94 kg/m.Marland Kitchen Filed Weights   07/16/20 0929  Weight: 89.2 kg    Family History  Problem Relation Age of Onset  . COPD Father      Atrial Fibrillation Management history:  Previous antiarrhythmic drugs: none Previous cardioversions: 04/10/20 Previous ablations: 06/18/20 CHADS2VASC score: 3 Anticoagulation history: Eliquis   Past Medical History:  Diagnosis Date  . Dysrhythmia    AFib  . Hypercholesteremia    Past Surgical History:  Procedure Laterality Date  . ATRIAL FIBRILLATION ABLATION N/A 06/18/2020   Procedure: ATRIAL FIBRILLATION ABLATION;  Surgeon: Constance Haw, MD;  Location: [facility];  Service: Cardiovascular;  Laterality: N/A;  . CARDIOVERSION N/A 04/10/2020   Procedure: CARDIOVERSION;  Surgeon: Arnoldo Lenis, MD;  Location: [facility];  Service: Endoscopy;  Laterality: N/A;  . HERNIA REPAIR Left   . TOTAL KNEE ARTHROPLASTY Left     Current Outpatient Medications  Medication Sig Dispense Refill  . acetaminophen (TYLENOL) 500 MG tablet Take 1,000 mg by mouth every 6 (six) hours as needed for moderate pain or headache.    . Ascorbic Acid (VITAMIN C PO) Take 1 capsule by mouth daily.    . cholecalciferol (VITAMIN D3) 25 MCG (1000 UNIT) tablet Take 1,000 Units by mouth daily.    Marland Kitchen ELIQUIS 5 MG TABS tablet Take 5 mg by mouth 2 (two) times daily.    . furosemide (LASIX) 40 MG tablet Take 40 mg by mouth daily.    . metoprolol succinate (TOPROL XL) 50 MG 24 hr tablet Take 1 tablet (50 mg total) by mouth daily. Take with or immediately following a meal. 90 tablet 3  . Omega-3 Fatty Acids (FISH OIL) 1200 MG CAPS Take 1,200 mg by mouth daily.    . potassium chloride (KLOR-CON) 10 MEQ tablet Take 10 mEq by mouth daily.    . rosuvastatin (CRESTOR) 10 MG tablet Take 10 mg by mouth at bedtime.     Marland Kitchen zinc gluconate 50 MG tablet Take 50 mg by mouth daily.     No current facility-administered medications for this encounter.    Allergies  Allergen Reactions  . Penicillins Other (See Comments)    Childhood reaction.  Social History   Socioeconomic History  . Marital status: Married    Spouse name: Not on file  . Number of children: Not on file  . Years of education: Not on file  . Highest education level: Not on file  Occupational History  . Not on file  Tobacco Use  . Smoking status: Never Smoker  . Smokeless tobacco: Never Used  Vaping Use  . Vaping Use: Never used  Substance and Sexual Activity  . Alcohol use: Not Currently    Comment: socially  . Drug use: Never  . Sexual activity: Yes  Other Topics Concern  . Not on file  Social  History Narrative  . Not on file   Social Determinants of Health   Financial Resource Strain: Not on file  Food Insecurity: Not on file  Transportation Needs: Not on file  Physical Activity: Not on file  Stress: Not on file  Social Connections: Not on file  Intimate Partner Violence: Not on file     ROS- All systems are reviewed and negative except as per the HPI above.  Physical Exam: Vitals:   07/16/20 0929  BP: 132/80  Pulse: 84  Weight: 89.2 kg  Height: 6\' 1"  (1.854 m)    GEN- The patient is well appearing, alert and oriented x 3 today.   Head- normocephalic, atraumatic Eyes-  Sclera clear, conjunctiva pink Ears- hearing intact Oropharynx- clear Neck- supple  Lungs- Clear to ausculation bilaterally, normal work of breathing Heart- Regular rate and rhythm, no murmurs, rubs or gallops  GI- soft, NT, ND, + BS Extremities- no clubbing, cyanosis, or edema MS- no significant deformity or atrophy Skin- no rash or lesion Psych- euthymic mood, full affect Neuro- strength and sensation are intact  Wt Readings from Last 3 Encounters:  07/16/20 89.2 kg  06/18/20 88 kg  05/06/20 92.1 kg    EKG today demonstrates SR HR 84, T wave inv lateral, PR 134, QRS 98, QTc 430  Echo 03/26/20 demonstrated  1. Left ventricular ejection fraction, by estimation, is 30 to 35%. The  left ventricle has moderately decreased function. The left ventricle  demonstrates global hypokinesis. Left ventricular diastolic parameters are  indeterminate.  2. Right ventricular systolic function is normal. The right ventricular  size is normal. There is normal pulmonary artery systolic pressure.  3. Left atrial size was severely dilated.  4. Right atrial size was mildly dilated.  5. The mitral valve is normal in structure. Mild mitral valve  regurgitation. No evidence of mitral stenosis.  6. The aortic valve is tricuspid. There is mild calcification of the  aortic valve. There is mild thickening  of the aortic valve. Aortic valve  regurgitation is trivial. No aortic stenosis is present.  7. The inferior vena cava is normal in size with greater than 50%  respiratory variability, suggesting right atrial pressure of 3 mmHg.   Epic records are reviewed at length today  CHA2DS2-VASc Score = 3  The patient's score is based upon: CHF History: Yes HTN History: Yes Diabetes History: No Stroke History: No Vascular Disease History: No Age Score: 1 Gender Score: 0      ASSESSMENT AND PLAN: 1. Persistent Atrial Fibrillation (ICD10:  I48.19) The patient's CHA2DS2-VASc score is 3, indicating a 3.2% annual risk of stroke.   S/p afib ablation with Dr Curt Bears 06/18/20 Patient in [place] today. Reassured patient that some episodes of afib soon after ablation are common.  Continue Eliquis 5 mg BID with no missed doses for at  least 3 months post ablation.  Continue Toprol 50 mg daily  2. Secondary Hypercoagulable State (ICD10:  D68.69) The patient is at significant risk for stroke/thromboembolism based upon his CHA2DS2-VASc Score of 3.  Continue Apixaban (Eliquis).   3. HTN Stable, no changes today.  4. Chronic systolic CHF Suspected tachycardia mediated. Hopefully this will improve with SR. No signs or symptoms of fluid overload.   Follow up with Dr Elberta Fortis as scheduled.    Jorja Loa PA-C [facility] [address] [place], [place] [address] [phone] 07/16/2020 9:39 AM

## 2020-07-16 ENCOUNTER — Other Ambulatory Visit: Payer: Self-pay

## 2020-07-16 ENCOUNTER — Ambulatory Visit (HOSPITAL_COMMUNITY)
Admission: RE | Admit: 2020-07-16 | Discharge: 2020-07-16 | Disposition: A | Discharge status: Home / Self Care | Payer: Medicare Other | Source: Ambulatory Visit | Attending: Physician Assistant | Admitting: Physician Assistant

## 2020-07-16 ENCOUNTER — Encounter (HOSPITAL_COMMUNITY): Payer: Self-pay | Admitting: Physician Assistant

## 2020-07-16 VITALS — BP 132/80 | HR 84 | Ht 73.0 in | Wt 196.6 lb

## 2020-07-16 DIAGNOSIS — I5022 Chronic systolic (congestive) heart failure: Secondary | ICD-10-CM | POA: Insufficient documentation

## 2020-07-16 DIAGNOSIS — E785 Hyperlipidemia, unspecified: Secondary | ICD-10-CM | POA: Diagnosis not present

## 2020-07-16 DIAGNOSIS — Z7901 Long term (current) use of anticoagulants: Secondary | ICD-10-CM | POA: Insufficient documentation

## 2020-07-16 DIAGNOSIS — I4510 Unspecified right bundle-branch block: Secondary | ICD-10-CM | POA: Diagnosis not present

## 2020-07-16 DIAGNOSIS — I110 Hypertensive heart disease with heart failure: Secondary | ICD-10-CM | POA: Insufficient documentation

## 2020-07-16 DIAGNOSIS — D6869 Other thrombophilia: Secondary | ICD-10-CM | POA: Diagnosis not present

## 2020-07-16 DIAGNOSIS — I4819 Other persistent atrial fibrillation: Secondary | ICD-10-CM

## 2020-07-16 HISTORY — DX: Other persistent atrial fibrillation: I48.19

## 2020-07-16 HISTORY — DX: Other thrombophilia: D68.69

## 2020-08-06 DIAGNOSIS — D0361 Melanoma in situ of right upper limb, including shoulder: Secondary | ICD-10-CM | POA: Diagnosis not present

## 2020-08-06 DIAGNOSIS — L570 Actinic keratosis: Secondary | ICD-10-CM | POA: Diagnosis not present

## 2020-08-06 DIAGNOSIS — Z1283 Encounter for screening for malignant neoplasm of skin: Secondary | ICD-10-CM | POA: Diagnosis not present

## 2020-08-06 DIAGNOSIS — D225 Melanocytic nevi of trunk: Secondary | ICD-10-CM | POA: Diagnosis not present

## 2020-08-06 DIAGNOSIS — X32XXXD Exposure to sunlight, subsequent encounter: Secondary | ICD-10-CM | POA: Diagnosis not present

## 2020-08-18 ENCOUNTER — Other Ambulatory Visit: Payer: Self-pay

## 2020-08-18 ENCOUNTER — Emergency Department (HOSPITAL_COMMUNITY)
Admission: EM | Admit: 2020-08-18 | Discharge: 2020-08-19 | Disposition: A | Discharge status: Home / Self Care | Payer: Medicare Other | Attending: Emergency Medicine | Admitting: Emergency Medicine

## 2020-08-18 DIAGNOSIS — D0361 Melanoma in situ of right upper limb, including shoulder: Secondary | ICD-10-CM | POA: Diagnosis not present

## 2020-08-18 DIAGNOSIS — Z7901 Long term (current) use of anticoagulants: Secondary | ICD-10-CM | POA: Diagnosis not present

## 2020-08-18 DIAGNOSIS — L7622 Postprocedural hemorrhage and hematoma of skin and subcutaneous tissue following other procedure: Secondary | ICD-10-CM | POA: Insufficient documentation

## 2020-08-18 DIAGNOSIS — Z96652 Presence of left artificial knee joint: Secondary | ICD-10-CM | POA: Diagnosis not present

## 2020-08-18 DIAGNOSIS — Z79899 Other long term (current) drug therapy: Secondary | ICD-10-CM | POA: Insufficient documentation

## 2020-08-18 DIAGNOSIS — T148XXA Other injury of unspecified body region, initial encounter: Secondary | ICD-10-CM

## 2020-08-18 DIAGNOSIS — L988 Other specified disorders of the skin and subcutaneous tissue: Secondary | ICD-10-CM | POA: Diagnosis not present

## 2020-08-18 MED ORDER — SILVER NITRATE-POT NITRATE 75-25 % EX MISC
1.0000 "application " | Freq: Once | CUTANEOUS | Status: AC
  Administered 2020-08-18: 1 via TOPICAL
  Filled 2020-08-18: qty 10

## 2020-08-18 MED ORDER — TRANEXAMIC ACID FOR EPISTAXIS
500.0000 mg | Freq: Once | TOPICAL | Status: AC
  Administered 2020-08-18: 500 mg via TOPICAL
  Filled 2020-08-18: qty 10

## 2020-08-18 NOTE — ED Triage Notes (Signed)
Pt POV from home with cc of surgical problem. 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 Pt is on blood thinners and cant get it to stop bleeding. Pt has a quarter sized open area. Bleeding uncontrolled. Pressure dressing applied

## 2020-08-18 NOTE — ED Provider Notes (Signed)
[facility] EMERGENCY DEPARTMENT Provider Note   CSN: [id number] Arrival date & time: 08/18/20  2229   Time seen 11:12 PM  History Chief Complaint  Patient presents with  . Post-op Problem    Evan Ayala is a 70 y.o. male.  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  He had more tissue removed today this morning around 9:30 AM.  He states everything was fine until about 8 PM he was laying in bed watching TV and all of a sudden realized that it was bleeding.  He states he put pressure on it for about 2 hours but he could never get it to stop bleeding.  Patient is on Eliquis.  PCP Celene Squibb, MD   Past Medical History:  Diagnosis Date  . Dysrhythmia    AFib  . Hypercholesteremia     Patient Active Problem List   Diagnosis Date Noted  . Persistent atrial fibrillation ([place]) 07/16/2020  . Secondary hypercoagulable state ([place]) 07/16/2020    Past Surgical History:  Procedure Laterality Date  . ATRIAL FIBRILLATION ABLATION N/A 06/18/2020   Procedure: ATRIAL FIBRILLATION ABLATION;  Surgeon: Constance Haw, MD;  Location: [place] [facility];  Service: Cardiovascular;  Laterality: N/A;  . CARDIOVERSION N/A 04/10/2020   Procedure: CARDIOVERSION;  Surgeon: Arnoldo Lenis, MD;  Location[facility];  Service: Endoscopy;  Laterality: N/A;  . HERNIA REPAIR Left   . TOTAL KNEE ARTHROPLASTY Left        Family History  Problem Relation Age of Onset  . COPD Father     Social History   Tobacco Use  . Smoking status: Never Smoker  . Smokeless tobacco: Never Used  Vaping Use  . Vaping Use: Never used  Substance Use Topics  . Alcohol use: Not Currently    Comment: socially  . Drug use: Never    Home Medications Prior to Admission medications   Medication Sig Start Date End Date Taking? Authorizing Provider  acetaminophen (TYLENOL) 500 MG tablet Take 1,000 mg  by mouth every 6 (six) hours as needed for moderate pain or headache.    [provider]  Ascorbic Acid (VITAMIN C PO) Take 1 capsule by mouth daily.    [provider]  cholecalciferol (VITAMIN D3) 25 MCG (1000 UNIT) tablet Take 1,000 Units by mouth daily.    [provider]  ELIQUIS 5 MG TABS tablet Take 5 mg by mouth 2 (two) times daily. 03/10/20   [provider]  furosemide (LASIX) 40 MG tablet Take 40 mg by mouth daily.    [provider]  metoprolol succinate (TOPROL XL) 50 MG 24 hr tablet Take 1 tablet (50 mg total) by mouth daily. Take with or immediately following a meal. 06/18/20 06/18/21  Camnitz, Ocie Doyne, MD  Omega-3 Fatty Acids (FISH OIL) 1200 MG CAPS Take 1,200 mg by mouth daily.    [provider]  potassium chloride (KLOR-CON) 10 MEQ tablet Take 10 mEq by mouth daily. 03/10/20   [provider]  rosuvastatin (CRESTOR) 10 MG tablet Take 10 mg by mouth at bedtime.  12/13/19   [provider]  zinc gluconate 50 MG tablet Take 50 mg by mouth daily.    [provider]    Allergies    Penicillins  Review of Systems   Review of Systems  All other  systems reviewed and are negative.   Physical Exam Updated Vital Signs BP 130/86   Pulse 83   Temp 97.8 F (36.6 C) (Oral)   Resp 18   Ht 6\' 1"  (1.854 m)   Wt 89.2 kg   SpO2 96%   BMI 25.94 kg/m   Physical Exam Vitals and nursing note reviewed.  Constitutional:      General: He is not in acute distress.    Appearance: Normal appearance. He is normal weight. He is not ill-appearing or toxic-appearing.  HENT:     Head: Normocephalic and atraumatic.     Right Ear: External ear normal.     Left Ear: External ear normal.  Eyes:     Extraocular Movements: Extraocular movements intact.     Conjunctiva/sclera: Conjunctivae normal.     Pupils: Pupils are equal, round, and reactive to light.  Cardiovascular:     Rate and Rhythm: Normal rate.   Pulmonary:     Effort: Pulmonary effort is normal. No respiratory distress.  Musculoskeletal:     Cervical back: Normal range of motion.  Skin:    Comments: Patient has a pressure dressing applied to his right upper arm.  There is no visible blood seen through the dressing.  Nurses report however during triage he was dripping blood through a dressing they had previously placed.  Neurological:     General: No focal deficit present.     Mental Status: He is alert and oriented to person, place, and time.     Cranial Nerves: No cranial nerve deficit.  Psychiatric:        Mood and Affect: Mood normal.        Behavior: Behavior normal.        Thought Content: Thought content normal.     ED Results / Procedures / Treatments   Labs (all labs ordered are listed, but only abnormal results are displayed) Labs Reviewed - No data to display  EKG None  Radiology No results found.  Procedures Procedures  Medications Ordered in ED Medications  tranexamic acid (CYKLOKAPRON) 1000 MG/10ML topical solution 500 mg (500 mg Topical Given 08/18/20 2328)  silver nitrate applicators applicator 1 application (1 application Topical Given 08/18/20 2328)    ED Course  I have reviewed the triage vital signs and the nursing notes.  Pertinent labs & imaging results that were available during my care of the patient were reviewed by me and considered in my medical decision making (see chart for details).    MDM Rules/Calculators/A&P                          11:19 PM Patient's pressure dressing was removed.  He has some type of  dressing over the wound it appears to be a cottonball however it is stuck to the wound and its bleeding around it and underneath it.  I used a silver nitrate stick just to swab the area where the bleeding seemed to be occurring from.  As we got close to the wound the dressings were bloodsoaked.  I then put TXA on a folded 4 x 4 and placed it directly over the wound which is  approximately the size of a quarter.  I then placed some other folded 4 x 4's over that and then we used a Coban wrap over top of it.   Recheck at 12:20 AM there is no bleeding seeping through the dressing.  I checked his hands and his  fingers are warm without color changes.  He denies any pain or numbness in his fingers.  His radial pulses are equal in both wrists.  We discussed his aftercare.    Final Clinical Impression(s) / ED Diagnoses Final diagnoses:  Bleeding from wound    Rx / DC Orders ED Discharge Orders    None     Plan discharge  Rolland Porter, MD, Barbette Or, MD 08/19/20 [phone]

## 2020-08-19 NOTE — Discharge Instructions (Addendum)
Leave the dressing in place until Thursday or Friday, February 3 or fourth.  If you start getting pain or numbness or paleness of your fingers you should loosen the dressing.  Follow-up with the dermatologist as needed.  Return if it starts bleeding and you cannot get it to stop again.

## 2020-09-15 DIAGNOSIS — H43392 Other vitreous opacities, left eye: Secondary | ICD-10-CM | POA: Diagnosis not present

## 2020-09-18 ENCOUNTER — Other Ambulatory Visit: Payer: Self-pay

## 2020-09-18 ENCOUNTER — Encounter: Payer: Self-pay | Admitting: Cardiology

## 2020-09-18 ENCOUNTER — Ambulatory Visit (INDEPENDENT_AMBULATORY_CARE_PROVIDER_SITE_OTHER): Payer: Medicare Other | Admitting: Cardiology

## 2020-09-18 VITALS — BP 134/86 | HR 88 | Ht 73.0 in | Wt 196.0 lb

## 2020-09-18 DIAGNOSIS — I4819 Other persistent atrial fibrillation: Secondary | ICD-10-CM | POA: Diagnosis not present

## 2020-09-18 DIAGNOSIS — I428 Other cardiomyopathies: Secondary | ICD-10-CM

## 2020-09-18 DIAGNOSIS — L0390 Cellulitis, unspecified: Secondary | ICD-10-CM | POA: Diagnosis not present

## 2020-09-18 DIAGNOSIS — M25569 Pain in unspecified knee: Secondary | ICD-10-CM | POA: Diagnosis not present

## 2020-09-18 DIAGNOSIS — Z6829 Body mass index (BMI) 29.0-29.9, adult: Secondary | ICD-10-CM | POA: Diagnosis not present

## 2020-09-18 DIAGNOSIS — I10 Essential (primary) hypertension: Secondary | ICD-10-CM | POA: Diagnosis not present

## 2020-09-18 DIAGNOSIS — E663 Overweight: Secondary | ICD-10-CM | POA: Diagnosis not present

## 2020-09-18 DIAGNOSIS — R7301 Impaired fasting glucose: Secondary | ICD-10-CM | POA: Diagnosis not present

## 2020-09-18 DIAGNOSIS — I480 Paroxysmal atrial fibrillation: Secondary | ICD-10-CM | POA: Diagnosis not present

## 2020-09-18 DIAGNOSIS — H538 Other visual disturbances: Secondary | ICD-10-CM | POA: Diagnosis not present

## 2020-09-18 DIAGNOSIS — I5022 Chronic systolic (congestive) heart failure: Secondary | ICD-10-CM | POA: Diagnosis not present

## 2020-09-18 DIAGNOSIS — E782 Mixed hyperlipidemia: Secondary | ICD-10-CM | POA: Diagnosis not present

## 2020-09-18 DIAGNOSIS — I509 Heart failure, unspecified: Secondary | ICD-10-CM | POA: Diagnosis not present

## 2020-09-18 DIAGNOSIS — Z136 Encounter for screening for cardiovascular disorders: Secondary | ICD-10-CM | POA: Diagnosis not present

## 2020-09-18 NOTE — Progress Notes (Signed)
Electrophysiology Office Note   Date:  09/18/2020   ID:  Evan Ayala, DOB 02-Nov-1950, MRN [id number]  PCP:  Celene Squibb, MD  Cardiologist: Harrington Challenger Primary Electrophysiologist:  Luigi Stuckey Meredith Leeds, MD    Chief Complaint: Atrial fibrillation   History of Present Illness: Evan Ayala is a 70 y.o. male who is being seen today for the evaluation of atrial fibrillation at the request of Celene Squibb, MD. Presenting today for electrophysiology evaluation.  He has a history significant for hypertension, hyperlipidemia, atrial fibrillation, and systolic heart failure.  His main complaints with atrial fibrillation are shortness of breath and fatigue.  An echo that showed an ejection fraction of 30 to 35%.  He is status post AF ablation 06/18/2020.  Today, denies symptoms of palpitations, chest pain, shortness of breath, orthopnea, PND, lower extremity edema, claudication, dizziness, presyncope, syncope, bleeding, or neurologic sequela. The patient is tolerating medications without difficulties.  Since being seen he has done well.  He had a few episodes of atrial fibrillation immediately after his ablation, but he has not had any in the last few months.   Past Medical History:  Diagnosis Date  . Dysrhythmia    AFib  . Hypercholesteremia   . Persistent atrial fibrillation ([place]) 07/16/2020  . Secondary hypercoagulable state ([place]) 07/16/2020   Past Surgical History:  Procedure Laterality Date  . ATRIAL FIBRILLATION ABLATION N/A 06/18/2020   Procedure: ATRIAL FIBRILLATION ABLATION;  Surgeon: Constance Haw, MD;  Location: [facility];  Service: Cardiovascular;  Laterality: N/A;  . CARDIOVERSION N/A 04/10/2020   Procedure: CARDIOVERSION;  Surgeon: Arnoldo Lenis, MD;  [facility];  Service: Endoscopy;  Laterality: N/A;  . HERNIA REPAIR Left   . TOTAL KNEE ARTHROPLASTY Left      Current Outpatient Medications  Medication Sig Dispense Refill  . acetaminophen  (TYLENOL) 500 MG tablet Take 1,000 mg by mouth every 6 (six) hours as needed for moderate pain or headache.    . Ascorbic Acid (VITAMIN C PO) Take 1 capsule by mouth daily.    . cholecalciferol (VITAMIN D3) 25 MCG (1000 UNIT) tablet Take 1,000 Units by mouth daily.    Marland Kitchen ELIQUIS 5 MG TABS tablet Take 5 mg by mouth 2 (two) times daily.    . furosemide (LASIX) 40 MG tablet Take 40 mg by mouth daily.    . metoprolol succinate (TOPROL XL) 50 MG 24 hr tablet Take 1 tablet (50 mg total) by mouth daily. Take with or immediately following a meal. 90 tablet 3  . Omega-3 Fatty Acids (FISH OIL) 1200 MG CAPS Take 1,200 mg by mouth daily.    . potassium chloride (KLOR-CON) 10 MEQ tablet Take 10 mEq by mouth daily.    . rosuvastatin (CRESTOR) 10 MG tablet Take 10 mg by mouth at bedtime.     Marland Kitchen zinc gluconate 50 MG tablet Take 50 mg by mouth daily.     No current facility-administered medications for this visit.    Allergies:   Penicillins   Social History:  The patient  reports that he has never smoked. He has never used smokeless tobacco. He reports previous alcohol use. He reports that he does not use drugs.   Family History:  The patient's family history includes COPD in his father.   ROS:  Please see the history of present illness.   Otherwise, review of systems is positive for none.   All other systems are reviewed and negative.  PHYSICAL EXAM: VS:  BP 134/86   Pulse 88   Ht 6\' 1"  (1.854 m)   Wt 196 lb (88.9 kg)   SpO2 97%   BMI 25.86 kg/m  , BMI Body mass index is 25.86 kg/m. GEN: Well nourished, well developed, in no acute distress  HEENT: normal  Neck: no JVD, carotid bruits, or masses Cardiac: RRR; no murmurs, rubs, or gallops,no edema  Respiratory:  clear to auscultation bilaterally, normal work of breathing GI: soft, nontender, nondistended, + BS MS: no deformity or atrophy  Skin: warm and dry Neuro:  Strength and sensation are intact Psych: euthymic mood, full affect  EKG:  EKG  is ordered today. Personal review of the ekg ordered shows sinus rhythm, rate 88, anterior T wave inversion  Recent Labs: 03/21/2020: TSH 2.690 04/08/2020: B Natriuretic Peptide 582.0 05/01/2020: NT-Pro BNP 2,950 06/02/2020: BUN 15; Creatinine, Ser 0.83; Hemoglobin 16.3; Platelets 219; Potassium 4.4; Sodium 139    Lipid Panel  No results found for: CHOL, TRIG, HDL, CHOLHDL, VLDL, LDLCALC, LDLDIRECT   Wt Readings from Last 3 Encounters:  09/18/20 196 lb (88.9 kg)  08/18/20 196 lb 10.4 oz (89.2 kg)  07/16/20 196 lb 9.6 oz (89.2 kg)      Other studies Reviewed: Additional studies/ records that were reviewed today include: TTE 03/26/20  Review of the above records today demonstrates:  1. Left ventricular ejection fraction, by estimation, is 30 to 35%. The  left ventricle has moderately decreased function. The left ventricle  demonstrates global hypokinesis. Left ventricular diastolic parameters are  indeterminate.  2. Right ventricular systolic function is normal. The right ventricular  size is normal. There is normal pulmonary artery systolic pressure.  3. Left atrial size was severely dilated.  4. Right atrial size was mildly dilated.  5. The mitral valve is normal in structure. Mild mitral valve  regurgitation. No evidence of mitral stenosis.  6. The aortic valve is tricuspid. There is mild calcification of the  aortic valve. There is mild thickening of the aortic valve. Aortic valve  regurgitation is trivial. No aortic stenosis is present.  7. The inferior vena cava is normal in size with greater than 50%  respiratory variability, suggesting right atrial pressure of 3 mmHg.    ASSESSMENT AND PLAN:  1.  Persistent atrial fibrillation: Currently on atenolol and Eliquis.  CHA2DS2-VASc of 2.  Status post AF ablation 06/19/2011.  He has remained in sinus rhythm.  We [name] continue with current management.  2.  Hypertension: Currently well controlled  3.  Chronic systolic  heart failure: Likely tachycardia continue atenolol.  We [name] repeat his echo to determine if his ejection fraction has improved.  Case discussed with primary cardiology  Current medicines are reviewed at length with the patient today.   The patient does not have concerns regarding his medicines.  The following changes were made today: None  Labs/ tests ordered today include:  Orders Placed This Encounter  Procedures  . EKG 12-Lead  . ECHOCARDIOGRAM COMPLETE     Disposition:   FU with Shona Pardo 3 months  Signed, Bricelyn Freestone Meredith Leeds, MD  09/18/2020 3:17 PM     [address] [place] [address] [phone] (office) [phone] (fax)

## 2020-09-18 NOTE — Patient Instructions (Signed)
Medication Instructions:  Your physician recommends that you continue on your current medications as directed. Please refer to the Current Medication list given to you today.  *If you need a refill on your cardiac medications before your next appointment, please call your pharmacy*   Lab Work: None ordered If you have labs (blood work) drawn today and your tests are completely normal, you will receive your results only by: Marland Kitchen MyChart Message (if you have MyChart) OR . A paper copy in the mail If you have any lab test that is abnormal or we need to change your treatment, we will call you to review the results.   Testing/Procedures: Your physician has requested that you have an echocardiogram. Echocardiography is a painless test that uses sound waves to create images of your heart. It provides your doctor with information about the size and shape of your heart and how well your heart's chambers and valves are working. This procedure takes approximately one hour. There are no restrictions for this procedure.   Follow-Up: At [facility], you and your health needs are our priority.  As part of our continuing mission to provide you with exceptional heart care, we have created designated Provider Care Teams.  These Care Teams include your primary Cardiologist (physician) and Advanced Practice Providers (APPs -  Physician Assistants and Nurse Practitioners) who all work together to provide you with the care you need, when you need it.  Your next appointment:   3 month(s)  The format for your next appointment:   In Person  Provider:   Allegra Lai, MD    Thank you for choosing [facility]!!   Trinidad Curet, RN [phone]   Other Instructions

## 2020-09-22 DIAGNOSIS — M1711 Unilateral primary osteoarthritis, right knee: Secondary | ICD-10-CM | POA: Diagnosis not present

## 2020-09-22 DIAGNOSIS — Z6829 Body mass index (BMI) 29.0-29.9, adult: Secondary | ICD-10-CM | POA: Diagnosis not present

## 2020-09-22 DIAGNOSIS — I480 Paroxysmal atrial fibrillation: Secondary | ICD-10-CM | POA: Diagnosis not present

## 2020-09-22 DIAGNOSIS — R7301 Impaired fasting glucose: Secondary | ICD-10-CM | POA: Diagnosis not present

## 2020-09-22 DIAGNOSIS — I5022 Chronic systolic (congestive) heart failure: Secondary | ICD-10-CM | POA: Diagnosis not present

## 2020-09-22 DIAGNOSIS — E663 Overweight: Secondary | ICD-10-CM | POA: Diagnosis not present

## 2020-09-22 DIAGNOSIS — D0361 Melanoma in situ of right upper limb, including shoulder: Secondary | ICD-10-CM | POA: Diagnosis not present

## 2020-09-22 DIAGNOSIS — E782 Mixed hyperlipidemia: Secondary | ICD-10-CM | POA: Diagnosis not present

## 2020-09-22 DIAGNOSIS — I10 Essential (primary) hypertension: Secondary | ICD-10-CM | POA: Diagnosis not present

## 2020-10-08 ENCOUNTER — Other Ambulatory Visit: Payer: Self-pay

## 2020-10-08 ENCOUNTER — Ambulatory Visit (HOSPITAL_COMMUNITY)
Admission: RE | Admit: 2020-10-08 | Discharge: 2020-10-08 | Disposition: A | Discharge status: Home / Self Care | Payer: Medicare Other | Source: Ambulatory Visit | Attending: Cardiology | Admitting: Cardiology

## 2020-10-08 DIAGNOSIS — I4819 Other persistent atrial fibrillation: Secondary | ICD-10-CM | POA: Diagnosis not present

## 2020-10-08 DIAGNOSIS — I428 Other cardiomyopathies: Secondary | ICD-10-CM | POA: Insufficient documentation

## 2020-10-08 LAB — ECHOCARDIOGRAM COMPLETE
AR max vel: 2.98 cm2
AV Area VTI: 3.32 cm2
AV Area mean vel: 3.15 cm2
AV Mean grad: 4 mmHg
AV Peak grad: 8.2 mmHg
Ao pk vel: 1.43 m/s
Area-P 1/2: 2.37 cm2
S' Lateral: 3.2 cm

## 2020-10-08 NOTE — Progress Notes (Signed)
*  PRELIMINARY RESULTS* Echocardiogram 2D Echocardiogram has been performed.  Evan Ayala 10/08/2020, 10:28 AM

## 2020-10-15 DIAGNOSIS — H43393 Other vitreous opacities, bilateral: Secondary | ICD-10-CM | POA: Diagnosis not present

## 2020-11-11 DIAGNOSIS — Z85820 Personal history of malignant melanoma of skin: Secondary | ICD-10-CM | POA: Diagnosis not present

## 2020-11-11 DIAGNOSIS — X32XXXD Exposure to sunlight, subsequent encounter: Secondary | ICD-10-CM | POA: Diagnosis not present

## 2020-11-11 DIAGNOSIS — Z1283 Encounter for screening for malignant neoplasm of skin: Secondary | ICD-10-CM | POA: Diagnosis not present

## 2020-11-11 DIAGNOSIS — L570 Actinic keratosis: Secondary | ICD-10-CM | POA: Diagnosis not present

## 2020-11-11 DIAGNOSIS — Z08 Encounter for follow-up examination after completed treatment for malignant neoplasm: Secondary | ICD-10-CM | POA: Diagnosis not present

## 2020-11-11 DIAGNOSIS — D225 Melanocytic nevi of trunk: Secondary | ICD-10-CM | POA: Diagnosis not present

## 2020-12-10 DIAGNOSIS — L821 Other seborrheic keratosis: Secondary | ICD-10-CM | POA: Diagnosis not present

## 2020-12-10 DIAGNOSIS — D485 Neoplasm of uncertain behavior of skin: Secondary | ICD-10-CM | POA: Diagnosis not present

## 2020-12-10 DIAGNOSIS — D2271 Melanocytic nevi of right lower limb, including hip: Secondary | ICD-10-CM | POA: Diagnosis not present

## 2020-12-10 DIAGNOSIS — L820 Inflamed seborrheic keratosis: Secondary | ICD-10-CM | POA: Diagnosis not present

## 2020-12-10 DIAGNOSIS — Z1283 Encounter for screening for malignant neoplasm of skin: Secondary | ICD-10-CM | POA: Diagnosis not present

## 2020-12-22 ENCOUNTER — Encounter: Payer: Self-pay | Admitting: Cardiology

## 2020-12-22 ENCOUNTER — Ambulatory Visit (INDEPENDENT_AMBULATORY_CARE_PROVIDER_SITE_OTHER): Payer: Medicare Other | Admitting: Cardiology

## 2020-12-22 ENCOUNTER — Other Ambulatory Visit: Payer: Self-pay

## 2020-12-22 VITALS — BP 142/80 | HR 80 | Ht 73.0 in | Wt 204.0 lb

## 2020-12-22 DIAGNOSIS — I4819 Other persistent atrial fibrillation: Secondary | ICD-10-CM

## 2020-12-22 IMAGING — CT CT HEART MORPH/PULM VEIN W/ CM & W/O CA SCORE
1 of 5 series · 9 of 20 positions shown, 12 images · IV contrast (APPLIED)
Comparison: None.
COMPARISON: None.

Addendum:
EXAM:
OVER-READ INTERPRETATION  CT CHEST

The following report is an over-read performed by radiologist Dr.
Tanu Oxendine [REDACTED] on 06/11/2020. This
over-read does not include interpretation of cardiac or coronary
anatomy or pathology. The coronary CTA interpretation by the
cardiologist is attached.
CLINICAL DATA: 69-year-old male with persistent atrial fibrillation
scheduled for an ablation.
Cardiac CT/CTA
TECHNIQUE: The patient was scanned on a Siemens Somatom scanner.

[Series 6: best syst · axial · 0.39mm/px · z∈[+1237,+1348]mm · 9 of 346 slices shown, 12 images]
[im 35/346  vessel]
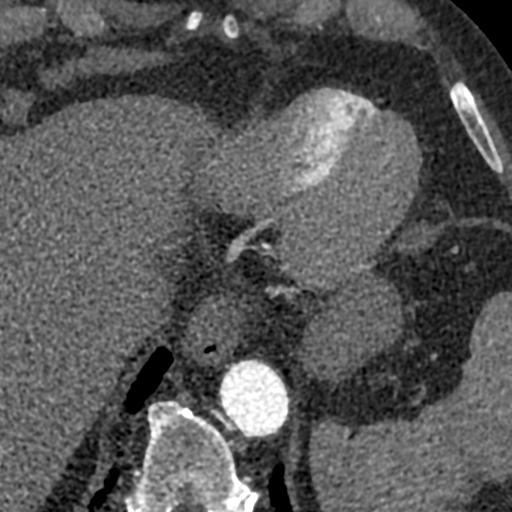
[im 35/346  lung]
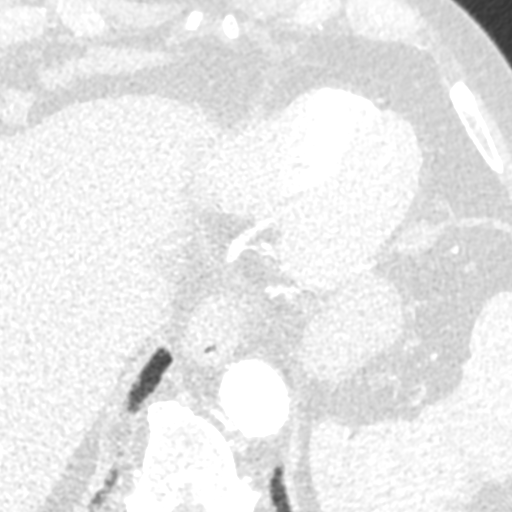
[im 70/346  vessel]
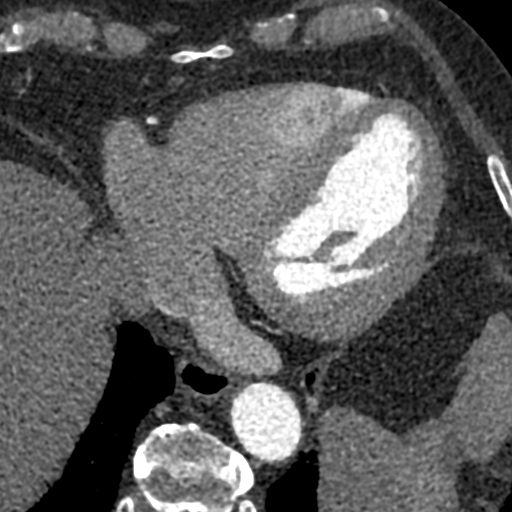
[im 104/346  vessel]
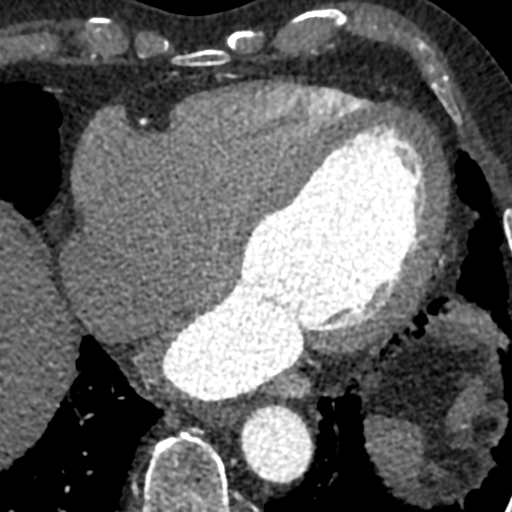
[im 139/346  vessel]
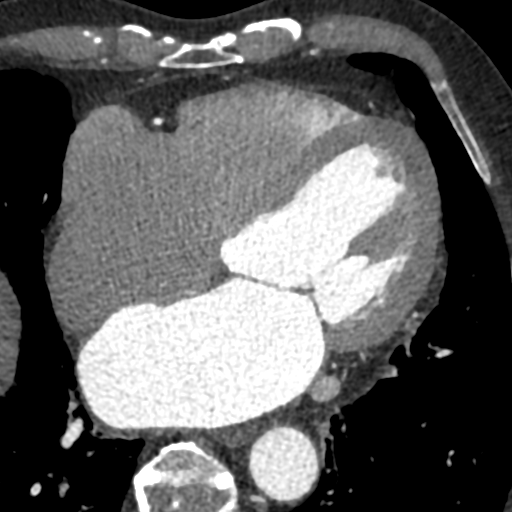
[im 173/346  vessel]
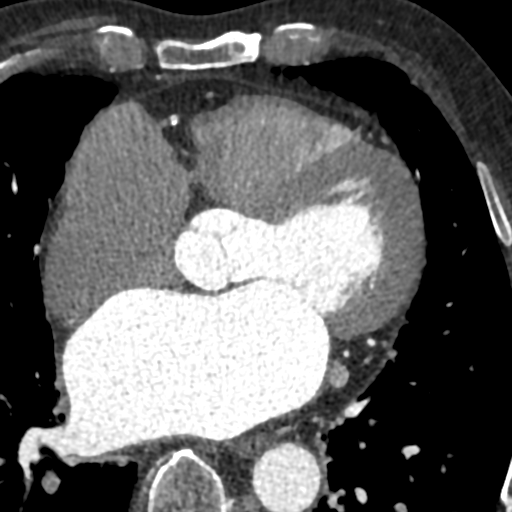
[im 173/346  lung]
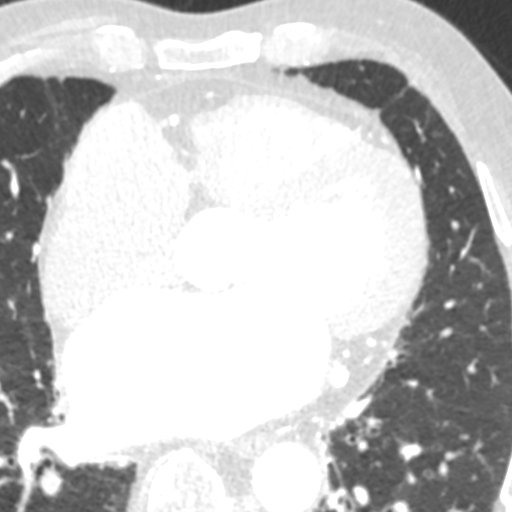
[im 208/346  vessel]
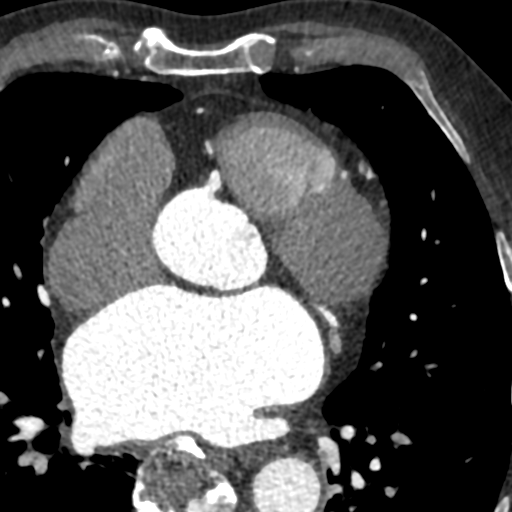
[im 242/346  vessel]
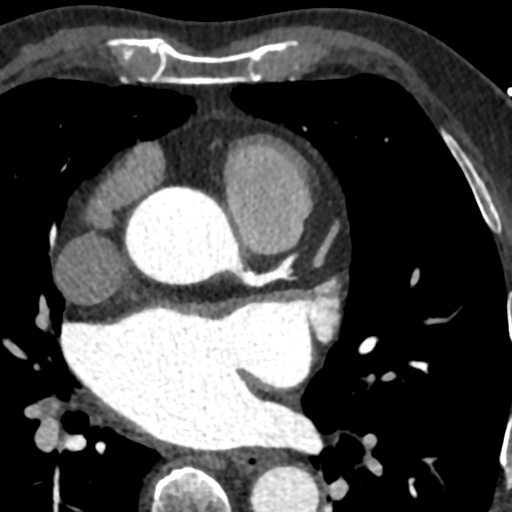
[im 277/346  vessel]
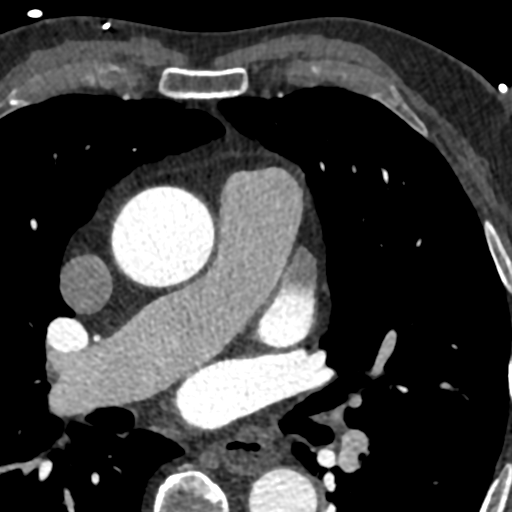
[im 311/346  vessel]
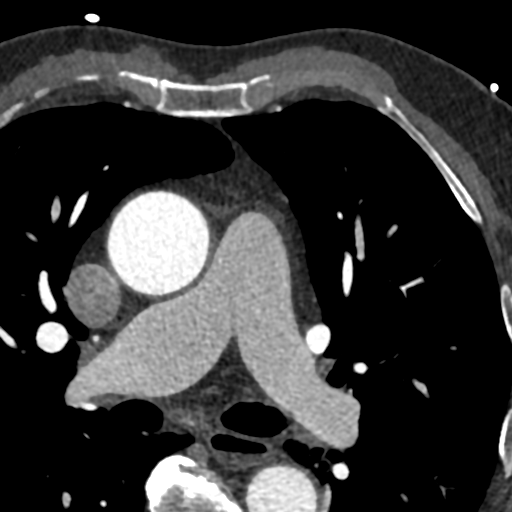
[im 311/346  lung]
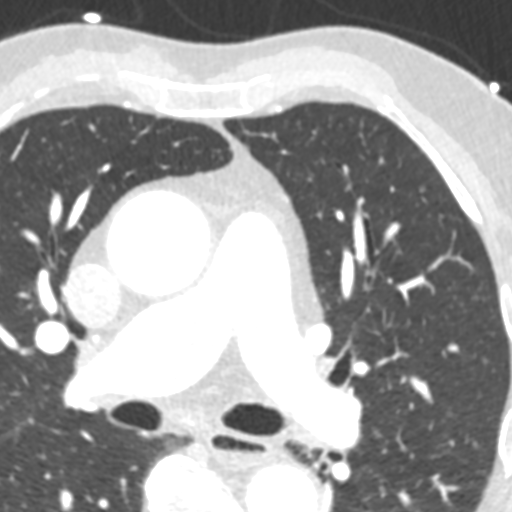

[9 of 20 positions shown; findings below may reference images not displayed]

FINDINGS: Vascular: On initial arterial face imaging, there is low-density
noted in the left atrial appendage. On delayed images, this appears
homogeneous suggesting this is not clot but instead represents
admixture of contrast and unopacified blood. Heart is mildly
enlarged. Aorta upper limits normal in caliber at 3.9 cm in the
ascending thoracic aorta. Opacified

Mediastinum/Nodes: No adenopathy.

Lungs/Pleura: Linear scarring in the lung bases.  No effusions.

Upper Abdomen: Imaging into the upper abdomen demonstrates no acute
findings.

Musculoskeletal: Chest wall soft tissues are unremarkable. No acute
bony abnormality.
IMPRESSION: Cardiomegaly. Low-density seen initially on arterial imaging in the
left atrial appendage a calms homogeneous with the remainder of the
left atrium on delayed images felt represent admixture of contrast
rather than clot.

No acute extra cardiac abnormality.
FINDINGS: A 120 kV prospective scan was triggered in the descending thoracic
aorta at 111 HU's. Gantry rotation speed was 280 msecs and
collimation was .9 mm. 100 mg of PO Metoprolol and no NTG was given.
The 3D data set was reconstructed in 5% intervals of the 60-80 % of
the R-R cycle. Diastolic phases were analyzed on a dedicated work
station using MPR, MIP and VRT modes. The patient received 80 cc of
contrast.

There is normal pulmonary vein drainage into the left atrium (2 on
the right and 2 on the left) with ostial measurements as follows:

RUPV: 16.8 x 16.5 mm

RLPV: 25.2 x 24.5 mm

LUPV: 20.9 x 19.0 mm

LLPV: 25.6 x 18.8 mm

The left atrial appendage is large with chicken wing morphology, one
lobe with ostial size 34 x 29 mm and length 61 mm. There is no
thrombus in the left atrial appendage.

The esophagus runs to the left from the left atrial midline and is
in the proximity to the ostia of the LUPV and LLPV.

Aorta:  Normal caliber.  No dissection or calcifications.

Aortic Valve: Trileaflet. Leaflets are thickened with trivial
calcifications.

Coronary Arteries: Normal coronary origin. Right dominance. The
study was performed without use of NTG and insufficient for plaque
evaluation. Calcium score is 134 that represents 52 percentile for
age/sex.
IMPRESSION: 1. There is normal pulmonary vein drainage into the left atrium.

2. The left atrial appendage is large with chicken wing morphology,
one lobe with ostial size 34 x 29 mm and length 61 mm. There is a
filling defect on the first pass imaging that resolves on delayed
imaging consistent with no thrombus.

3. The esophagus runs to the left from the left atrial midline and
is in the proximity to the ostia of the LUPV and LLPV.

*** End of Addendum ***
EXAM:
OVER-READ INTERPRETATION  CT CHEST

The following report is an over-read performed by radiologist Dr.
Tanu Oxendine [REDACTED] on 06/11/2020. This
over-read does not include interpretation of cardiac or coronary
anatomy or pathology. The coronary CTA interpretation by the
cardiologist is attached.
FINDINGS: Vascular: On initial arterial face imaging, there is low-density
noted in the left atrial appendage. On delayed images, this appears
homogeneous suggesting this is not clot but instead represents
admixture of contrast and unopacified blood. Heart is mildly
enlarged. Aorta upper limits normal in caliber at 3.9 cm in the
ascending thoracic aorta. Opacified

Mediastinum/Nodes: No adenopathy.

Lungs/Pleura: Linear scarring in the lung bases.  No effusions.

Upper Abdomen: Imaging into the upper abdomen demonstrates no acute
findings.

Musculoskeletal: Chest wall soft tissues are unremarkable. No acute
bony abnormality.
IMPRESSION: Cardiomegaly. Low-density seen initially on arterial imaging in the
left atrial appendage a calms homogeneous with the remainder of the
left atrium on delayed images felt represent admixture of contrast
rather than clot.

No acute extra cardiac abnormality.

## 2020-12-22 NOTE — Patient Instructions (Signed)
Evan Ayala

## 2020-12-22 NOTE — Progress Notes (Signed)
Electrophysiology Office Note   Date:  12/22/2020   ID:  Evan Ayala, DOB [birth year], MRN [id number]  PCP:  Celene Squibb, MD  Cardiologist: Harrington Challenger Primary Electrophysiologist:  Yemariam Magar Meredith Leeds, MD    Chief Complaint: Atrial fibrillation   History of Present Illness: Evan Ayala is a 70 y.o. male who is being seen today for the evaluation of atrial fibrillation at the request of Celene Squibb, MD. Presenting today for electrophysiology evaluation.  He has a history significant for hypertension, hyperlipidemia, atrial fibrillation, and systolic heart failure.  He is now status post A. fib ablation 06/18/2020.  Fortunately his ejection fraction has normalized since ablation.  Today, denies symptoms of palpitations, chest pain, shortness of breath, orthopnea, PND, lower extremity edema, claudication, dizziness, presyncope, syncope, bleeding, or neurologic sequela. The patient is tolerating medications without difficulties.  Since being seen he has done well.  He has had no further episodes of atrial fibrillation.  He is able to do all of his daily activities.  He has been working in the garden around his house.   Past Medical History:  Diagnosis Date  . Dysrhythmia    AFib  . Hypercholesteremia   . Persistent atrial fibrillation ([place]) 07/16/2020  . Secondary hypercoagulable state ([place]) 07/16/2020   Past Surgical History:  Procedure Laterality Date  . ATRIAL FIBRILLATION ABLATION N/A 06/18/2020   Procedure: ATRIAL FIBRILLATION ABLATION;  Surgeon: Constance Haw, MD;  Location: [place] [facility];  Service: Cardiovascular;  Laterality: N/A;  . CARDIOVERSION N/A 04/10/2020   Procedure: CARDIOVERSION;  Surgeon: Arnoldo Lenis, MD;  [facility];  Service: Endoscopy;  Laterality: N/A;  . HERNIA REPAIR Left   . TOTAL KNEE ARTHROPLASTY Left      Current Outpatient Medications  Medication Sig Dispense Refill  . acetaminophen (TYLENOL) 500 MG tablet Take 1,000  mg by mouth every 6 (six) hours as needed for moderate pain or headache.    . Ascorbic Acid (VITAMIN C PO) Take 1 capsule by mouth daily.    . cholecalciferol (VITAMIN D3) 25 MCG (1000 UNIT) tablet Take 1,000 Units by mouth daily.    Marland Kitchen ELIQUIS 5 MG TABS tablet Take 5 mg by mouth 2 (two) times daily.    . furosemide (LASIX) 40 MG tablet Take 40 mg by mouth daily.    . metoprolol succinate (TOPROL XL) 50 MG 24 hr tablet Take 1 tablet (50 mg total) by mouth daily. Take with or immediately following a meal. 90 tablet 3  . Omega-3 Fatty Acids (FISH OIL) 1200 MG CAPS Take 1,200 mg by mouth daily.    . potassium chloride (KLOR-CON) 10 MEQ tablet Take 10 mEq by mouth daily.    . rosuvastatin (CRESTOR) 10 MG tablet Take 10 mg by mouth at bedtime.     Marland Kitchen zinc gluconate 50 MG tablet Take 50 mg by mouth daily.     No current facility-administered medications for this visit.    Allergies:   Penicillins   Social History:  The patient  reports that he has never smoked. He has never used smokeless tobacco. He reports previous alcohol use. He reports that he does not use drugs.   Family History:  The patient's family history includes COPD in his father.   ROS:  Please see the history of present illness.   Otherwise, review of systems is positive for none.   All other systems are reviewed and negative.   PHYSICAL EXAM: VS:  BP Marland Kitchen)  142/80   Pulse 80   Ht 6\' 1"  (1.854 m)   Wt 204 lb (92.5 kg)   BMI 26.91 kg/m  , BMI Body mass index is 26.91 kg/m. GEN: Well nourished, well developed, in no acute distress  HEENT: normal  Neck: no JVD, carotid bruits, or masses Cardiac: RRR; no murmurs, rubs, or gallops,no edema  Respiratory:  clear to auscultation bilaterally, normal work of breathing GI: soft, nontender, nondistended, + BS MS: no deformity or atrophy  Skin: warm and dry Neuro:  Strength and sensation are intact Psych: euthymic mood, full affect  EKG:  EKG is ordered today. Personal review of the  ekg ordered shows sinus rhythm, rate 80  Recent Labs: 03/21/2020: TSH 2.690 04/08/2020: B Natriuretic Peptide 582.0 05/01/2020: NT-Pro BNP 2,950 06/02/2020: BUN 15; Creatinine, Ser 0.83; Hemoglobin 16.3; Platelets 219; Potassium 4.4; Sodium 139    Lipid Panel  No results found for: CHOL, TRIG, HDL, CHOLHDL, VLDL, LDLCALC, LDLDIRECT   Wt Readings from Last 3 Encounters:  12/22/20 204 lb (92.5 kg)  09/18/20 196 lb (88.9 kg)  08/18/20 196 lb 10.4 oz (89.2 kg)      Other studies Reviewed: Additional studies/ records that were reviewed today include: TTE 10/08/2020 Review of the above records today demonstrates:  1. Left ventricular ejection fraction, by estimation, is 55 to 60%. The  left ventricle has normal function. The left ventricle has no regional  wall motion abnormalities. Left ventricular diastolic parameters are  consistent with Grade I diastolic  dysfunction (impaired relaxation).  2. Right ventricular systolic function is normal. The right ventricular  size is normal.  3. Left atrial size was severely dilated.  4. Right atrial size was moderately dilated.  5. The mitral valve is normal in structure. Mild mitral valve  regurgitation. No evidence of mitral stenosis.  6. The aortic valve is tricuspid. Aortic valve regurgitation is not  visualized. No aortic stenosis is present.  7. The inferior vena cava is normal in size with greater than 50%  respiratory variability, suggesting right atrial pressure of 3 mmHg.    ASSESSMENT AND PLAN:  1.  Persistent atrial fibrillation: Currently on atenolol and Eliquis.  CHA2DS2-VASc of 2.  Status post ablation 06/18/2020.  Has remained in sinus rhythm.  Continue with current management.  *  2.  Hypertension: Mildly elevated today.  Usually well controlled.  No changes.  3.  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  Repeat echo shows a normalized ejection fraction.  Continue  with current management.   Current medicines are reviewed at length with the patient today.   The patient does not have concerns regarding his medicines.  The following changes were made today: None  Labs/ tests ordered today include:  Orders Placed This Encounter  Procedures  . EKG 12-Lead     Disposition:   FU with Nirvan Laban 6 months  Signed, Kambre Messner Meredith Leeds, MD  12/22/2020 11:46 AM     [facility] [address] [place] [address] [phone] (office) [phone] (fax)

## 2020-12-23 DIAGNOSIS — Z23 Encounter for immunization: Secondary | ICD-10-CM | POA: Diagnosis not present

## 2020-12-26 ENCOUNTER — Telehealth: Payer: Self-pay | Admitting: Cardiology

## 2020-12-26 MED ORDER — METOPROLOL SUCCINATE ER 50 MG PO TB24: 50.0000 mg | ORAL_TABLET | Freq: Two times a day (BID) | ORAL | 3 refills | Status: DC

## 2020-12-26 NOTE — Telephone Encounter (Signed)
STAT if HR is under 50 or over 120 (normal HR is 60-100 beats per minute)  What is your heart rate? 160 rn 160-165 in the past hour   Do you have a log of your heart rate readings (document readings)? Ranging normal prior to today   Do you have any other symptoms? Could tell something was wrong almost like SOB, but not that bad.

## 2020-12-26 NOTE — Telephone Encounter (Signed)
Spoke with the patient who states that he was out running errands and when he got home he could feel his heart racing. He used his [employer] which showed that he was in A Fib with heart rate 160-165. He states that he feels fine, no SOB, dizziness, or chest pain.  Spoke with Dr. Curt Bears who advised for the patient to increase Toprol XL to 50 mg twice per day and to follow up in the A Fib clinc.  Spoke with the patient again and advised to increase Toprol. Patient verbalized understanding.  A Fib clinic is closed for the afternoon. Message has been left for them that the patient needs an appointment.

## 2020-12-29 ENCOUNTER — Telehealth (HOSPITAL_COMMUNITY): Payer: Self-pay | Admitting: Physician Assistant

## 2020-12-29 NOTE — Telephone Encounter (Signed)
Called patient this morning to schedule an appt for [facility]Clinic because pt had called Dr. Curt Bears office on 12/26/20 in AF.  Pt states he is feeling good and his AF subsided on 6/10 prior to him taking increased dose of Toprol XL 50 mg.  Pt advised he does not need to be seen in AFClinic at this time but if he has any further problems to call [facility].  He is also aware, per Rushie Goltz, RN, if he has another AF episode, he can increase the Toprol XL as previously instructed by Antonieta Iba, RN, pt voiced understanding.

## 2021-02-04 DIAGNOSIS — Z1283 Encounter for screening for malignant neoplasm of skin: Secondary | ICD-10-CM | POA: Diagnosis not present

## 2021-02-04 DIAGNOSIS — Z85820 Personal history of malignant melanoma of skin: Secondary | ICD-10-CM | POA: Diagnosis not present

## 2021-02-04 DIAGNOSIS — D225 Melanocytic nevi of trunk: Secondary | ICD-10-CM | POA: Diagnosis not present

## 2021-02-04 DIAGNOSIS — Z08 Encounter for follow-up examination after completed treatment for malignant neoplasm: Secondary | ICD-10-CM | POA: Diagnosis not present

## 2021-02-04 DIAGNOSIS — X32XXXD Exposure to sunlight, subsequent encounter: Secondary | ICD-10-CM | POA: Diagnosis not present

## 2021-02-04 DIAGNOSIS — L570 Actinic keratosis: Secondary | ICD-10-CM | POA: Diagnosis not present

## 2021-03-18 DIAGNOSIS — H524 Presbyopia: Secondary | ICD-10-CM | POA: Diagnosis not present

## 2021-03-18 DIAGNOSIS — H40013 Open angle with borderline findings, low risk, bilateral: Secondary | ICD-10-CM | POA: Diagnosis not present

## 2021-03-30 DIAGNOSIS — R7301 Impaired fasting glucose: Secondary | ICD-10-CM | POA: Diagnosis not present

## 2021-03-30 DIAGNOSIS — I10 Essential (primary) hypertension: Secondary | ICD-10-CM | POA: Diagnosis not present

## 2021-04-02 DIAGNOSIS — Z0001 Encounter for general adult medical examination with abnormal findings: Secondary | ICD-10-CM | POA: Diagnosis not present

## 2021-04-02 DIAGNOSIS — I5022 Chronic systolic (congestive) heart failure: Secondary | ICD-10-CM | POA: Diagnosis not present

## 2021-04-02 DIAGNOSIS — E663 Overweight: Secondary | ICD-10-CM | POA: Diagnosis not present

## 2021-04-02 DIAGNOSIS — I480 Paroxysmal atrial fibrillation: Secondary | ICD-10-CM | POA: Diagnosis not present

## 2021-04-02 DIAGNOSIS — R7301 Impaired fasting glucose: Secondary | ICD-10-CM | POA: Diagnosis not present

## 2021-04-02 DIAGNOSIS — M25561 Pain in right knee: Secondary | ICD-10-CM | POA: Diagnosis not present

## 2021-04-02 DIAGNOSIS — I10 Essential (primary) hypertension: Secondary | ICD-10-CM | POA: Diagnosis not present

## 2021-04-02 DIAGNOSIS — E782 Mixed hyperlipidemia: Secondary | ICD-10-CM | POA: Diagnosis not present

## 2021-04-02 DIAGNOSIS — M545 Low back pain, unspecified: Secondary | ICD-10-CM | POA: Diagnosis not present

## 2021-04-02 DIAGNOSIS — Z85828 Personal history of other malignant neoplasm of skin: Secondary | ICD-10-CM | POA: Diagnosis not present

## 2021-04-03 ENCOUNTER — Telehealth: Payer: Self-pay

## 2021-04-03 NOTE — Telephone Encounter (Signed)
[place]

## 2021-04-30 DIAGNOSIS — Z23 Encounter for immunization: Secondary | ICD-10-CM | POA: Diagnosis not present

## 2021-05-06 DIAGNOSIS — Z1283 Encounter for screening for malignant neoplasm of skin: Secondary | ICD-10-CM | POA: Diagnosis not present

## 2021-05-06 DIAGNOSIS — D225 Melanocytic nevi of trunk: Secondary | ICD-10-CM | POA: Diagnosis not present

## 2021-05-06 DIAGNOSIS — X32XXXD Exposure to sunlight, subsequent encounter: Secondary | ICD-10-CM | POA: Diagnosis not present

## 2021-05-06 DIAGNOSIS — Z85820 Personal history of malignant melanoma of skin: Secondary | ICD-10-CM | POA: Diagnosis not present

## 2021-05-06 DIAGNOSIS — L570 Actinic keratosis: Secondary | ICD-10-CM | POA: Diagnosis not present

## 2021-05-06 DIAGNOSIS — Z08 Encounter for follow-up examination after completed treatment for malignant neoplasm: Secondary | ICD-10-CM | POA: Diagnosis not present

## 2021-05-13 DIAGNOSIS — Z23 Encounter for immunization: Secondary | ICD-10-CM | POA: Diagnosis not present

## 2021-06-28 NOTE — Progress Notes (Signed)
Cardiology Office Note Date:  06/30/2021  Patient ID:  Evan, Ayala [birth year], MRN [id number] PCP:  Celene Squibb, MD  Cardiologist:  Dr. Harrington Challenger Electrophysiologist: Dr. Curt Bears    Chief Complaint: 6 mo  History of Present Illness: Evan Ayala is a 70 y.o. male with history of HTN, HLD, chronic CHF (systolic suspect tachy-mediated with improved EF), Afib  He comes in today to be seen for Dr. Curt Bears, last seen by him June 2022, doing well, no symptoms of his AFib, more active working around the house and garden.  No changes were made.  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 NO near syncope or syncope. He had a melanoma taken off several months ago and bled quite abit after that, had to get it cauterized to stop.   Otherwise, no bleeding or signs of bleeding   Afib Hx Diagnosed Sept 2021 PVI ablation 06/18/2020  AAD None to date  Past Medical History:  Diagnosis Date   Dysrhythmia    AFib   Hypercholesteremia    Persistent atrial fibrillation ([place]) 07/16/2020   Secondary hypercoagulable state ([place]) 07/16/2020    Past Surgical History:  Procedure Laterality Date   ATRIAL FIBRILLATION ABLATION N/A 06/18/2020   Procedure: ATRIAL FIBRILLATION ABLATION;  Surgeon: Constance Haw, MD;  Location: [place] [facility];  Service: Cardiovascular;  Laterality: N/A;   CARDIOVERSION N/A 04/10/2020   Procedure: CARDIOVERSION;  Surgeon: Arnoldo Lenis, MD;  [facility];  Service: Endoscopy;  Laterality: N/A;   HERNIA REPAIR Left    TOTAL KNEE ARTHROPLASTY Left     Current Outpatient Medications  Medication Sig Dispense Refill   acetaminophen (TYLENOL) 500 MG tablet Take 1,000 mg by mouth every 6 (six) hours as  needed for moderate pain or headache.     Ascorbic Acid (VITAMIN C PO) Take 1 capsule by mouth daily.     cholecalciferol (VITAMIN D3) 25 MCG (1000 UNIT) tablet Take 1,000 Units by mouth daily.     ELIQUIS 5 MG TABS tablet Take 5 mg by mouth 2 (two) times daily.     furosemide (LASIX) 40 MG tablet Take 40 mg by mouth daily.     metoprolol succinate (TOPROL-XL) 50 MG 24 hr tablet Take 50 mg by mouth daily. Take with or immediately following a meal.     Omega-3 Fatty Acids (FISH OIL) 1200 MG CAPS Take 1,200 mg by mouth daily.     potassium chloride (KLOR-CON) 10 MEQ tablet Take 10 mEq by mouth daily.     rosuvastatin (CRESTOR) 10 MG tablet Take 10 mg by mouth at bedtime.      zinc gluconate 50 MG tablet Take 50 mg by mouth daily.     No current facility-administered medications for this visit.    Allergies:   Penicillins   Social History:  The patient  reports that he has never smoked. He has never used smokeless tobacco. He reports that he does not currently use alcohol. He reports that he does not use drugs.   Family History:  The patient's family history includes COPD in his father.  ROS:  Please see the history of present illness.  All other systems are reviewed and otherwise negative.   PHYSICAL EXAM:  VS:  BP (!) 142/84   Pulse 79   Ht 6\' 1"  (1.854 m)   Wt 213 lb 9.6 oz (96.9 kg)   SpO2 98%   BMI 28.18 kg/m  BMI: Body mass index is 28.18 kg/m. Well nourished, well developed, in no acute distress HEENT: normocephalic, atraumatic Neck: no JVD, carotid bruits or masses Cardiac:   RRR; no significant murmurs, no rubs, or gallops Lungs:  CTA b/l, no wheezing, rhonchi or rales Abd: soft, nontender MS: no deformity or atrophy Ext: no edema Skin: warm and dry, no rash Neuro:  No gross deficits appreciated Psych: euthymic mood, full affect   EKG:  not done today  TTE 10/08/2020 Review of the above records today demonstrates:   1. Left ventricular ejection fraction, by  estimation, is 55 to 60%. The  left ventricle has normal function. The left ventricle has no regional  wall motion abnormalities. Left ventricular diastolic parameters are  consistent with Grade I diastolic  dysfunction (impaired relaxation).   2. Right ventricular systolic function is normal. The right ventricular  size is normal.   3. Left atrial size was severely dilated.   4. Right atrial size was moderately dilated.   5. The mitral valve is normal in structure. Mild mitral valve  regurgitation. No evidence of mitral stenosis.   6. The aortic valve is tricuspid. Aortic valve regurgitation is not  visualized. No aortic stenosis is present.   7. The inferior vena cava is normal in size with greater than 50%  respiratory variability, suggesting right atrial pressure of 3 mmHg  06/18/2020: EPS/Ablation CONCLUSIONS: 1. Atrial fibrillation upon presentation.   2. Successful electrical isolation and anatomical encircling of all four pulmonary veins with radiofrequency current.  A WACA approach was used 3. Additional left atrial ablation was performed with a standard box lesion created along the posterior wall of the left atrium 4. Atrial fibrillation successfully cardioverted to sinus rhythm. 5. No early apparent complications.   03/26/20: TTE IMPRESSIONS   1. Left ventricular ejection fraction, by estimation, is 30 to 35%. The  left ventricle has moderately decreased function. The left ventricle  demonstrates global hypokinesis. Left ventricular diastolic parameters are  indeterminate.   2. Right ventricular systolic function is normal. The right ventricular  size is normal. There is normal pulmonary artery systolic pressure.   3. Left atrial size was severely dilated.   4. Right atrial size was mildly dilated.   5. The mitral valve is normal in structure. Mild mitral valve  regurgitation. No evidence of mitral stenosis.   6. The aortic valve is tricuspid. There is mild calcification  of the  aortic valve. There is mild thickening of the aortic valve. Aortic valve  regurgitation is trivial. No aortic stenosis is present.   7. The inferior vena cava is normal in size with greater than 50%  respiratory variability, suggesting right atrial pressure of 3 mmHg.   Recent Labs: No results found for requested labs within last 8760 hours.  No results found for requested labs within last 8760 hours.   CrCl cannot be calculated (Patient's most recent lab result is older than the maximum 21 days allowed.).   Wt Readings from Last 3 Encounters:  06/30/21 213 lb 9.6 oz (96.9 kg)  12/22/20 204 lb (92.5 kg)  09/18/20 196 lb (88.9 kg)     Other studies reviewed: Additional studies/records reviewed today include: summarized above  ASSESSMENT  AND PLAN:  Persistent Afib CHA2DS2Vasc is 3, on Eliquis, appropriately dosed Sees his PMD and has labs done regularly there  Chronic CHF (systolic) Recovered LVEF Excellent exertional capacity No exam findings or symptoms of volume OL or clinic change  HTN A little high today, he is asked to keep an eye on it, sees his PMD soon and will have another check on it then    Disposition: F/u with [place] in 7mo, sooner if needed  Current medicines are reviewed at length with the patient today.  The patient did not have any concerns regarding medicines.  Venetia Night, PA-C 06/30/2021 3:00 PM     [place]  [id number] [phone] (office)  [phone] (fax)

## 2021-06-30 ENCOUNTER — Encounter: Payer: Self-pay | Admitting: Physician Assistant

## 2021-06-30 ENCOUNTER — Ambulatory Visit (INDEPENDENT_AMBULATORY_CARE_PROVIDER_SITE_OTHER): Payer: Medicare Other | Admitting: Physician Assistant

## 2021-06-30 ENCOUNTER — Other Ambulatory Visit: Payer: Self-pay

## 2021-06-30 VITALS — BP 142/84 | HR 79 | Ht 73.0 in | Wt 213.6 lb

## 2021-06-30 DIAGNOSIS — I5022 Chronic systolic (congestive) heart failure: Secondary | ICD-10-CM | POA: Diagnosis not present

## 2021-06-30 DIAGNOSIS — I4819 Other persistent atrial fibrillation: Secondary | ICD-10-CM

## 2021-06-30 DIAGNOSIS — I10 Essential (primary) hypertension: Secondary | ICD-10-CM

## 2021-06-30 NOTE — Patient Instructions (Signed)
Medication Instructions:   Your physician recommends that you continue on your current medications as directed. Please refer to the Current Medication list given to you today.  *If you need a refill on your cardiac medications before your next appointment, please call your pharmacy*   Lab Work: [place]    If you have labs (blood work) drawn today and your tests are completely normal, you will receive your results only by: [place] (if you have MyChart) OR A paper copy in the mail If you have any lab test that is abnormal or we need to change your treatment, we will call you to review the results.   Testing/Procedures: NONE ORDERED  TODAY     Follow-Up: At [facility], you and your health needs are our priority.  As part of our continuing mission to provide you with exceptional heart care, we have created designated Provider Care Teams.  These Care Teams include your primary Cardiologist (physician) and Advanced Practice Providers (APPs -  Physician Assistants and Nurse Practitioners) who all work together to provide you with the care you need, when you need it.  We recommend signing up for the patient portal called "MyChart".  Sign up information is provided on this After Visit Summary.  MyChart is used to connect with patients for Virtual Visits (Telemedicine).  Patients are able to view lab/test results, encounter notes, upcoming appointments, etc.  Non-urgent messages can be sent to your provider as well.   To learn more about what you can do with MyChart, go to [email or web address].    Your next appointment:   6 month(s)  The format for your next appointment:   In Person  Provider:   You may see Will Meredith Leeds, MD or one of the following Advanced Practice Providers on your designated Care Team:   Tommye Standard, [place] Legrand Como "Jonni Sanger" Chalmers Cater, [place]   Other Instructions

## 2021-07-29 DIAGNOSIS — Z08 Encounter for follow-up examination after completed treatment for malignant neoplasm: Secondary | ICD-10-CM | POA: Diagnosis not present

## 2021-07-29 DIAGNOSIS — L570 Actinic keratosis: Secondary | ICD-10-CM | POA: Diagnosis not present

## 2021-07-29 DIAGNOSIS — Z85820 Personal history of malignant melanoma of skin: Secondary | ICD-10-CM | POA: Diagnosis not present

## 2021-07-29 DIAGNOSIS — D225 Melanocytic nevi of trunk: Secondary | ICD-10-CM | POA: Diagnosis not present

## 2021-07-29 DIAGNOSIS — X32XXXD Exposure to sunlight, subsequent encounter: Secondary | ICD-10-CM | POA: Diagnosis not present

## 2021-07-29 DIAGNOSIS — Z1283 Encounter for screening for malignant neoplasm of skin: Secondary | ICD-10-CM | POA: Diagnosis not present

## 2021-07-31 DIAGNOSIS — E782 Mixed hyperlipidemia: Secondary | ICD-10-CM | POA: Diagnosis not present

## 2021-07-31 DIAGNOSIS — R7301 Impaired fasting glucose: Secondary | ICD-10-CM | POA: Diagnosis not present

## 2021-08-04 DIAGNOSIS — Z125 Encounter for screening for malignant neoplasm of prostate: Secondary | ICD-10-CM | POA: Diagnosis not present

## 2021-08-04 DIAGNOSIS — Z85828 Personal history of other malignant neoplasm of skin: Secondary | ICD-10-CM | POA: Diagnosis not present

## 2021-08-04 DIAGNOSIS — I10 Essential (primary) hypertension: Secondary | ICD-10-CM | POA: Diagnosis not present

## 2021-08-04 DIAGNOSIS — I480 Paroxysmal atrial fibrillation: Secondary | ICD-10-CM | POA: Diagnosis not present

## 2021-08-04 DIAGNOSIS — Z1211 Encounter for screening for malignant neoplasm of colon: Secondary | ICD-10-CM | POA: Diagnosis not present

## 2021-08-04 DIAGNOSIS — E782 Mixed hyperlipidemia: Secondary | ICD-10-CM | POA: Diagnosis not present

## 2021-08-04 DIAGNOSIS — R7301 Impaired fasting glucose: Secondary | ICD-10-CM | POA: Diagnosis not present

## 2021-08-04 DIAGNOSIS — I5022 Chronic systolic (congestive) heart failure: Secondary | ICD-10-CM | POA: Diagnosis not present

## 2021-08-04 DIAGNOSIS — M545 Low back pain, unspecified: Secondary | ICD-10-CM | POA: Diagnosis not present

## 2021-08-04 DIAGNOSIS — E663 Overweight: Secondary | ICD-10-CM | POA: Diagnosis not present

## 2021-08-04 DIAGNOSIS — M25561 Pain in right knee: Secondary | ICD-10-CM | POA: Diagnosis not present

## 2021-08-07 ENCOUNTER — Ambulatory Visit: Payer: Medicare Other | Admitting: Cardiology

## 2021-08-24 ENCOUNTER — Encounter: Payer: Self-pay | Admitting: *Deleted

## 2021-10-19 ENCOUNTER — Ambulatory Visit (INDEPENDENT_AMBULATORY_CARE_PROVIDER_SITE_OTHER): Payer: Medicare Other | Admitting: *Deleted

## 2021-10-19 VITALS — Ht 73.0 in | Wt 216.8 lb

## 2021-10-19 DIAGNOSIS — Z1211 Encounter for screening for malignant neoplasm of colon: Secondary | ICD-10-CM

## 2021-10-19 NOTE — Progress Notes (Signed)
Noted. Agree that he needs an OV.  ?

## 2021-10-19 NOTE — Progress Notes (Signed)
Pt is on blood thinners.  Made pt ov for 10/27/2021 at 10:30 with Venetia Night, [place]. ?

## 2021-10-22 ENCOUNTER — Encounter: Payer: Self-pay | Admitting: Cardiology

## 2021-10-26 NOTE — Progress Notes (Signed)
? ? ?GI Office Note   ? ?Referring Provider: Celene Squibb, MD ?Primary Care Physician:  Celene Squibb, MD  ?Primary GI: Dr. Gala Romney ? ?Chief Complaint  ? ?Chief Complaint  ?Patient presents with  ? Colonoscopy  ? ? ? ?History of Present Illness  ? ?Evan Ayala is a 70 y.o. male with a history of persistent HTN, HLD, chronic CHF (EF 55-60% on echo 10/08/20), Afib s/p ablation 2021 on Eliquis, and melanoma of the skin presenting today at the request of Celene Squibb, MD for  screening colonoscopy.  ? ?Last colonoscopy 11/19/2009 - unable to make out handwriting on report, through review appears as though it was normal.  ? ?Last labwork with mildly elevated LDL and Hgb A1c 5.8, albumin level 5. Otherwise normal. ? ?Today: ?Denies any GI complaints other than occasional acid reflux if he eats nuts or eats late at night, uses Tums as needed. Denies any peripheral edema, abdominal distention, chest pains, shortness of breath. Denies melena, hematochezia, hematemesis, constipation, diarrhea, abdominal pain, early satiety, unexplained weight loss, hematuria, epistaxis.  ? ?Had melanoma taken off his right arm January 2022 and bleed with that had to go to ED. Otherwise no issues with bleeding. On Eliquis 5 mg BID ? ?Has a cardiac device to check heart rhythm at home and has had no issues, follows with cardiology every 6 months. Has been doing good since his Atrial ablation and reported improvement in his ejection fraction and no more cardiac symptoms.  ? ? ?Past Medical History:  ?Diagnosis Date  ? Dysrhythmia   ? AFib  ? Hypercholesteremia   ? Persistent atrial fibrillation ([place]) 07/16/2020  ? Secondary hypercoagulable state ([place]) 07/16/2020  ? ? ?Past Surgical History:  ?Procedure Laterality Date  ? ATRIAL FIBRILLATION ABLATION N/A 06/18/2020  ? Procedure: ATRIAL FIBRILLATION ABLATION;  Surgeon: Constance Haw, MD;  Location: [place] [facility];  Service: Cardiovascular;  Laterality: N/A;  ? CARDIOVERSION N/A 04/10/2020   ? Procedure: CARDIOVERSION;  Surgeon: Arnoldo Lenis, MD;  [facility];  Service: Endoscopy;  Laterality: N/A;  ? HERNIA REPAIR Left   ? TOTAL KNEE ARTHROPLASTY Left   ? ? ?Current Outpatient Medications  ?Medication Sig Dispense Refill  ? acetaminophen (TYLENOL) 500 MG tablet Take 1,000 mg by mouth as needed for moderate pain or headache.    ? Ascorbic Acid (VITAMIN C PO) Take 1 capsule by mouth daily.    ? ELIQUIS 5 MG TABS tablet Take 5 mg by mouth 2 (two) times daily.    ? furosemide (LASIX) 40 MG tablet Take 40 mg by mouth daily.    ? metoprolol succinate (TOPROL-XL) 50 MG 24 hr tablet Take 50 mg by mouth 2 (two) times daily. Take with or immediately following a meal.    ? potassium chloride (KLOR-CON) 10 MEQ tablet Take 10 mEq by mouth daily.    ? rosuvastatin (CRESTOR) 10 MG tablet Take 10 mg by mouth at bedtime.     ? ?No current facility-administered medications for this visit.  ? ? ?Allergies as of 10/27/2021 - Review Complete 10/27/2021  ?Allergen Reaction Noted  ? Penicillins Other (See Comments) 03/31/2020  ? ? ?Family History  ?Problem Relation Age of Onset  ? COPD Father   ? ? ?Social History  ? ?Socioeconomic History  ? Marital status: Married  ?  Spouse name: Not on file  ? Number of children: Not on file  ? Years of education: Not on file  ?  Highest education level: Not on file  ?Occupational History  ? Not on file  ?Tobacco Use  ? Smoking status: Never  ? Smokeless tobacco: Never  ?Vaping Use  ? Vaping Use: Never used  ?Substance and Sexual Activity  ? Alcohol use: Not Currently  ?  Comment: socially  ? Drug use: Never  ? Sexual activity: Yes  ?Other Topics Concern  ? Not on file  ?Social History Narrative  ? Not on file  ? ?Social Determinants of Health  ? ?Financial Resource Strain: Not on file  ?Food Insecurity: Not on file  ?Transportation Needs: Not on file  ?Physical Activity: Not on file  ?Stress: Not on file  ?Social Connections: Not on file  ?Intimate Partner Violence:  Not on file  ? ? ? ?Review of Systems  ? ?Gen: Denies any fever, chills, fatigue, weight loss, lack of appetite.  ?CV: Denies chest pain, heart palpitations, peripheral edema, syncope.  ?Resp: Denies shortness of breath at rest or with exertion. Denies wheezing or cough.  ?GI: see HPI ?GU : Denies urinary burning, urinary frequency, urinary hesitancy ?MS: Denies joint pain, muscle weakness, cramps, or limitation of movement.  ?Derm: Denies rash, itching, dry skin ?Psych: Denies depression, anxiety, memory loss, and confusion ?Heme: Denies bruising, bleeding, and enlarged lymph nodes. ? ? ?Physical Exam  ? ?BP 128/74   Pulse 78   Temp 97.6 ?F (36.4 ?C) (Temporal)   Ht '6\' 1"'$  (1.854 m)   Wt 216 lb (98 kg)   BMI 28.50 kg/m?  ? ?General:   Alert and oriented. Pleasant and cooperative. Well-nourished and well-developed.  ?Head:  Normocephalic and atraumatic. ?Eyes:  Without icterus, sclera clear and conjunctiva pink.  ?Ears:  Normal auditory acuity. ?Lungs:  Clear to auscultation bilaterally. No wheezes, rales, or rhonchi. No distress.  ?Heart:  S1, S2 present without murmurs appreciated.  ?Abdomen:  +BS, soft, non-tender and non-distended. No HSM noted. No guarding or rebound. No masses appreciated.  ?Rectal:  Deferred  ?Msk:  Symmetrical without gross deformities. Normal posture. ?Extremities:  Without edema. ?Neurologic:  Alert and  oriented x4;  grossly normal neurologically. ?Skin:  Intact without significant lesions or rashes.Scars present from removal of skin lesions.  ?Psych:  Alert and cooperative. Normal mood and affect. ? ? ?Assessment  ? ?Evan Ayala is a 71 y.o. male with a history of persistent HTN, HLD, chronic CHF (EF 55-60% on echo 10/08/20), Afib s/p ablation 2021 on Eliquis, and melanoma of the skin presenting today with need to schedule screening colonoscopy.   ? ?Need for screening colonoscopy: Denies any GI symptoms. No alarm symptoms present. Last colonoscopy in 2011 reportedly normal. Due  for screening. On Eliquis for history of Afib s/p ablation in 2021, doing well. Will request okay from cardiology to hold for 48 hours. Will proceed with TCS with propofol in the near future.  I have discussed the risks, alternatives, benefits with regards to but not limited to the risk of reaction to medication, bleeding, infection, perforation and the patient is agreeable to proceed. All questions answered. The patient states understanding and desires to proceed. ? ? ?PLAN  ? ?Request okay to hold Eliquis for 48 hours prior to procedure.  ?Proceed with colonoscopy with propofol by Dr. Gala Romney in near future -  ASA 3 ?BMET prior to procedure  ? ?Venetia Night, MSN, FNP-BC, AGACNP-BC ?[facility] ?

## 2021-10-27 ENCOUNTER — Ambulatory Visit (INDEPENDENT_AMBULATORY_CARE_PROVIDER_SITE_OTHER): Payer: Medicare Other | Admitting: Gastroenterology

## 2021-10-27 ENCOUNTER — Telehealth: Payer: Self-pay | Admitting: *Deleted

## 2021-10-27 ENCOUNTER — Encounter: Payer: Self-pay | Admitting: Gastroenterology

## 2021-10-27 VITALS — BP 128/74 | HR 78 | Temp 97.6°F | Ht 73.0 in | Wt 216.0 lb

## 2021-10-27 DIAGNOSIS — Z1211 Encounter for screening for malignant neoplasm of colon: Secondary | ICD-10-CM

## 2021-10-27 DIAGNOSIS — R931 Abnormal findings on diagnostic imaging of heart and coronary circulation: Secondary | ICD-10-CM

## 2021-10-27 NOTE — Telephone Encounter (Signed)
Sent request for clearance to hold Eliqis for 48 hours.  ?

## 2021-10-27 NOTE — Telephone Encounter (Signed)
Attention: Preop ? ? ?We would like to request holding the following medication for patient please. ? ?Procedure: Colonoscopy  ? ?Date: TBD ? ?Medication to hold: Elquis for 48 hours  ? ?Surgeon: Dr. Gala Romney  ? ?Phone: [phone] ? ?Fax:  [phone] ? ?Type of Anesthesia:  Propofol   ASA III ? ? ? ? ? ? ?  ?

## 2021-10-27 NOTE — Patient Instructions (Signed)
We are scheduling you for a screening colonoscopy with Dr. Gala Romney in the near future. We are requesting for approval to hold you eliquis for 2 days prior to your procedure. No other medication changes needed.  ? ?You will receive your instructions in the mail prior to the procedure. We will contact you once we receive approval to hold eliquis.  ? ?It was a pleasure to meet you today! ? ?I want to create trusting relationships with patients. If you receive a survey regarding your visit,  I greatly appreciate you taking time to fill this out on paper or through your MyChart. I value your feedback. ? ?Venetia Night, MSN, FNP-BC, AGACNP-BC ?[facility] ? ? ?

## 2021-10-27 NOTE — Telephone Encounter (Signed)
Patient with diagnosis of afib on Eliquis for anticoagulation.   ? ?Procedure: colonoscopy ?Date of procedure: TBD ? ?CHA2DS2-VASc Score = 4  ?This indicates a 4.8% annual risk of stroke. ?The patient's score is based upon: ?CHF History: 1 ?HTN History: 1 ?Diabetes History: 0 ?Stroke History: 0 ?Vascular Disease History: 1 ?Age Score: 1 ?Gender Score: 0 ?  ?CrCl >170m/min ?Platelet count 229K ? ?Per office protocol, patient can hold Eliquis for 2 days prior to procedure as requested.   ? ? ?

## 2021-10-27 NOTE — Telephone Encounter (Signed)
? ?  Primary Cardiologist: Dorris Carnes, MD ? ?Chart reviewed as part of pre-operative protocol coverage. Given past medical history and time since last visit, based on ACC/AHA guidelines, ODILON CASS would be at acceptable risk for the planned procedure without further cardiovascular testing.  ? ?Guidelines for holding Eliquis from clinical pharmacist are as follows; ? ?CHA2DS2-VASc Score = 4  ?This indicates a 4.8% annual risk of stroke. ?The patient's score is based upon: ?CHF History: 1 ?HTN History: 1 ?Diabetes History: 0 ?Stroke History: 0 ?Vascular Disease History: 1 ?Age Score: 1 ?Gender Score: 0 ?  ?CrCl >168m/min ?Platelet count 229K ?  ?Per office protocol, patient can hold Eliquis for 2 days prior to procedure as requested.   ?  ? ?I will route this recommendation to the requesting party via Epic fax function and remove from pre-op pool. ? ?Please call with questions. ? ?MEmmaline Life NP-C ? ?  ?10/27/2021, 3:23 PM ?[place]?[address] ?Office ([phone] ([phone]? ?

## 2021-10-28 DIAGNOSIS — X32XXXD Exposure to sunlight, subsequent encounter: Secondary | ICD-10-CM | POA: Diagnosis not present

## 2021-10-28 DIAGNOSIS — Z08 Encounter for follow-up examination after completed treatment for malignant neoplasm: Secondary | ICD-10-CM | POA: Diagnosis not present

## 2021-10-28 DIAGNOSIS — Z1283 Encounter for screening for malignant neoplasm of skin: Secondary | ICD-10-CM | POA: Diagnosis not present

## 2021-10-28 DIAGNOSIS — D225 Melanocytic nevi of trunk: Secondary | ICD-10-CM | POA: Diagnosis not present

## 2021-10-28 DIAGNOSIS — Z85820 Personal history of malignant melanoma of skin: Secondary | ICD-10-CM | POA: Diagnosis not present

## 2021-10-28 DIAGNOSIS — L570 Actinic keratosis: Secondary | ICD-10-CM | POA: Diagnosis not present

## 2021-10-29 ENCOUNTER — Telehealth: Payer: Self-pay | Admitting: *Deleted

## 2021-10-29 NOTE — Telephone Encounter (Signed)
Received clearance to hold Elquis for 48 hours. Copy placed on providers desk and sent to scan center.  ?

## 2021-10-30 ENCOUNTER — Telehealth: Payer: Self-pay | Admitting: Internal Medicine

## 2021-10-30 MED ORDER — PEG 3350-KCL-NA BICARB-NACL 420 G PO SOLR: ORAL | 0 refills | Status: DC

## 2021-10-30 NOTE — Telephone Encounter (Signed)
Spoke with pt. Scheduled for 5/25 at 2:15pm. Aware will mail prep instructions with pre-op appointment. Rx sent to pharmacy ?

## 2021-10-30 NOTE — Addendum Note (Signed)
Addended by: Cheron Every on: 10/30/2021 10:41 AM ? ? Modules accepted: Orders ? ?

## 2021-10-30 NOTE — Telephone Encounter (Signed)
LMOVM for pt to call back 

## 2021-10-30 NOTE — Telephone Encounter (Signed)
See prior note

## 2021-10-30 NOTE — Telephone Encounter (Signed)
PATIENT RETURNED CALL, PLEASE CALL BACK  °

## 2021-11-02 ENCOUNTER — Encounter: Payer: Self-pay | Admitting: *Deleted

## 2021-12-03 NOTE — Patient Instructions (Signed)
Evan Ayala  12/03/2021     '@PREFPERIOPPHARMACY'$ @   Your procedure is scheduled on  12/10/2021.   Report to [facility] at  1245  P.M.   Call this number if you have problems the morning of surgery:  [phone]   Remember:  Follow the diet and prep instructions given to you by the office.    Your last dose of eliquis should be on 12/07/2021.     Take these medicines the morning of surgery with A SIP OF WATER                                 metoprolol.    Do not wear jewelry, make-up or nail polish.  Do not wear lotions, powders, or perfumes, or deodorant.  Do not shave 48 hours prior to surgery.  Men may shave face and neck.  Do not bring valuables to the hospital.  [facility] is not responsible for any belongings or valuables.  Contacts, dentures or bridgework may not be worn into surgery.  Leave your suitcase in the car.  After surgery it may be brought to your room.  For patients admitted to the hospital, discharge time will be determined by your treatment team.  Patients discharged the day of surgery will not be allowed to drive home and must have someone with them fr 24 hours.    Special instructions:   DO NOT smoke tobacco or vape fore 24 hours before your procedure.  Please read over the following fact sheets that you were given. Anesthesia Post-op Instructions and Care and Recovery After Surgery      Colonoscopy, Adult, Care After The following information offers guidance on how to care for yourself after your procedure. Your health care provider may also give you more specific instructions. If you have problems or questions, contact your health care provider. What can I expect after the procedure? After the procedure, it is common to have: A small amount of blood in your stool for 24 hours after the procedure. Some gas. Mild cramping or bloating of your abdomen. Follow these instructions at home: Eating and drinking  Drink enough fluid to  keep your urine pale yellow. Follow instructions from your health care provider about eating or drinking restrictions. Resume your normal diet as told by your health care provider. Avoid heavy or fried foods that are hard to digest. Activity Rest as told by your health care provider. Avoid sitting for a long time without moving. Get up to take short walks every 1-2 hours. This is important to improve blood flow and breathing. Ask for help if you feel weak or unsteady. Return to your normal activities as told by your health care provider. Ask your health care provider what activities are safe for you. Managing cramping and bloating  Try walking around when you have cramps or feel bloated. If directed, apply heat to your abdomen as told by your health care provider. Use the heat source that your health care provider recommends, such as a moist heat pack or a heating pad. Place a towel between your skin and the heat source. Leave the heat on for 20-30 minutes. Remove the heat if your skin turns bright red. This is especially important if you are unable to feel pain, heat, or cold. You have a greater risk of getting burned. General instructions If you were given a sedative during the  procedure, it can affect you for several hours. Do not drive or operate machinery until your health care provider says that it is safe. For the first 24 hours after the procedure: Do not sign important documents. Do not drink alcohol. Do your regular daily activities at a slower pace than normal. Eat soft foods that are easy to digest. Take over-the-counter and prescription medicines only as told by your health care provider. Keep all follow-up visits. This is important. Contact a health care provider if: You have blood in your stool 2-3 days after the procedure. Get help right away if: You have more than a small spotting of blood in your stool. You have large blood clots in your stool. You have swelling of your  abdomen. You have nausea or vomiting. You have a fever. You have increasing pain in your abdomen that is not relieved with medicine. These symptoms may be an emergency. Get help right away. Call 911. Do not wait to see if the symptoms will go away. Do not drive yourself to the hospital. Summary After the procedure, it is common to have a small amount of blood in your stool. You may also have mild cramping and bloating of your abdomen. If you were given a sedative during the procedure, it can affect you for several hours. Do not drive or operate machinery until your health care provider says that it is safe. Get help right away if you have a lot of blood in your stool, nausea or vomiting, a fever, or increased pain in your abdomen. This information is not intended to replace advice given to you by your health care provider. Make sure you discuss any questions you have with your health care provider. Document Revised: 02/25/2021 Document Reviewed: 02/25/2021 Elsevier Patient Education  [place] After This sheet gives you information about how to care for yourself after your procedure. Your health care provider may also give you more specific instructions. If you have problems or questions, contact your health care provider. What can I expect after the procedure? After the procedure, it is common to have: Tiredness. Forgetfulness about what happened after the procedure. Impaired judgment for important decisions. Nausea or vomiting. Some difficulty with balance. Follow these instructions at home: For the time period you were told by your health care provider:     Rest as needed. Do not participate in activities where you could fall or become injured. Do not drive or use machinery. Do not drink alcohol. Do not take sleeping pills or medicines that cause drowsiness. Do not make important decisions or sign legal documents. Do not take care of  children on your own. Eating and drinking Follow the diet that is recommended by your health care provider. Drink enough fluid to keep your urine pale yellow. If you vomit: Drink water, juice, or soup when you can drink without vomiting. Make sure you have little or no nausea before eating solid foods. General instructions Have a responsible adult stay with you for the time you are told. It is important to have someone help care for you until you are awake and alert. Take over-the-counter and prescription medicines only as told by your health care provider. If you have sleep apnea, surgery and certain medicines can increase your risk for breathing problems. Follow instructions from your health care provider about wearing your sleep device: Anytime you are sleeping, including during daytime naps. While taking prescription pain medicines, sleeping medicines, or medicines that make you  drowsy. Avoid smoking. Keep all follow-up visits as told by your health care provider. This is important. Contact a health care provider if: You keep feeling nauseous or you keep vomiting. You feel light-headed. You are still sleepy or having trouble with balance after 24 hours. You develop a rash. You have a fever. You have redness or swelling around the IV site. Get help right away if: You have trouble breathing. You have new-onset confusion at home. Summary For several hours after your procedure, you may feel tired. You may also be forgetful and have poor judgment. Have a responsible adult stay with you for the time you are told. It is important to have someone help care for you until you are awake and alert. Rest as told. Do not drive or operate machinery. Do not drink alcohol or take sleeping pills. Get help right away if you have trouble breathing, or if you suddenly become confused. This information is not intended to replace advice given to you by your health care provider. Make sure you discuss any  questions you have with your health care provider. Document Revised: 06/09/2021 Document Reviewed: 06/07/2019 Elsevier Patient Education  [place].

## 2021-12-07 ENCOUNTER — Encounter (HOSPITAL_COMMUNITY): Payer: Self-pay

## 2021-12-07 ENCOUNTER — Encounter (HOSPITAL_COMMUNITY)
Admission: RE | Admit: 2021-12-07 | Discharge: 2021-12-07 | Disposition: A | Discharge status: Home / Self Care | Payer: Medicare Other | Source: Ambulatory Visit | Attending: Internal Medicine | Admitting: Internal Medicine

## 2021-12-07 VITALS — BP 150/85 | HR 69 | Temp 97.8°F | Resp 18 | Ht 73.0 in | Wt 212.0 lb

## 2021-12-07 DIAGNOSIS — Z79899 Other long term (current) drug therapy: Secondary | ICD-10-CM

## 2021-12-07 DIAGNOSIS — Z01812 Encounter for preprocedural laboratory examination: Secondary | ICD-10-CM | POA: Diagnosis not present

## 2021-12-07 HISTORY — DX: Malignant (primary) neoplasm, unspecified: C80.1

## 2021-12-07 LAB — BASIC METABOLIC PANEL
Anion gap: 5 (ref 5–15)
BUN: 18 mg/dL (ref 8–23)
CO2: 27 mmol/L (ref 22–32)
Calcium: 9.2 mg/dL (ref 8.9–10.3)
Chloride: 105 mmol/L (ref 98–111)
Creatinine, Ser: 0.67 mg/dL (ref 0.61–1.24)
GFR, Estimated: >60 mL/min (ref >60)
Glucose, Bld: 107 mg/dL — ABNORMAL HIGH (ref 70–99)
Potassium: 4.5 mmol/L (ref 3.5–5.1)
Sodium: 137 mmol/L (ref 135–145)

## 2021-12-10 ENCOUNTER — Encounter (HOSPITAL_COMMUNITY): Payer: Self-pay | Admitting: Internal Medicine

## 2021-12-10 ENCOUNTER — Ambulatory Visit (HOSPITAL_COMMUNITY)
Admission: RE | Admit: 2021-12-10 | Discharge: 2021-12-10 | Disposition: A | Discharge status: Home / Self Care | Payer: Medicare Other | Attending: Internal Medicine | Admitting: Internal Medicine

## 2021-12-10 ENCOUNTER — Other Ambulatory Visit: Payer: Self-pay

## 2021-12-10 ENCOUNTER — Ambulatory Visit (HOSPITAL_BASED_OUTPATIENT_CLINIC_OR_DEPARTMENT_OTHER): Payer: Medicare Other | Admitting: Anesthesiology

## 2021-12-10 ENCOUNTER — Ambulatory Visit (HOSPITAL_COMMUNITY): Payer: Medicare Other | Admitting: Anesthesiology

## 2021-12-10 ENCOUNTER — Encounter (HOSPITAL_COMMUNITY)
Admission: RE | Disposition: A | Discharge status: Home / Self Care | Payer: Self-pay | Source: Home / Self Care | Attending: Internal Medicine

## 2021-12-10 DIAGNOSIS — M720 Palmar fascial fibromatosis [Dupuytren]: Secondary | ICD-10-CM

## 2021-12-10 DIAGNOSIS — Z1211 Encounter for screening for malignant neoplasm of colon: Secondary | ICD-10-CM | POA: Diagnosis not present

## 2021-12-10 DIAGNOSIS — I110 Hypertensive heart disease with heart failure: Secondary | ICD-10-CM

## 2021-12-10 DIAGNOSIS — I509 Heart failure, unspecified: Secondary | ICD-10-CM | POA: Diagnosis not present

## 2021-12-10 DIAGNOSIS — K5730 Diverticulosis of large intestine without perforation or abscess without bleeding: Secondary | ICD-10-CM

## 2021-12-10 DIAGNOSIS — I4891 Unspecified atrial fibrillation: Secondary | ICD-10-CM | POA: Diagnosis not present

## 2021-12-10 DIAGNOSIS — I4819 Other persistent atrial fibrillation: Secondary | ICD-10-CM

## 2021-12-10 DIAGNOSIS — I2510 Atherosclerotic heart disease of native coronary artery without angina pectoris: Secondary | ICD-10-CM | POA: Insufficient documentation

## 2021-12-10 DIAGNOSIS — D6869 Other thrombophilia: Secondary | ICD-10-CM

## 2021-12-10 DIAGNOSIS — R001 Bradycardia, unspecified: Secondary | ICD-10-CM

## 2021-12-10 DIAGNOSIS — I10 Essential (primary) hypertension: Secondary | ICD-10-CM

## 2021-12-10 DIAGNOSIS — R931 Abnormal findings on diagnostic imaging of heart and coronary circulation: Secondary | ICD-10-CM

## 2021-12-10 DIAGNOSIS — E785 Hyperlipidemia, unspecified: Secondary | ICD-10-CM

## 2021-12-10 HISTORY — PX: COLONOSCOPY WITH PROPOFOL: SHX5780

## 2021-12-10 SURGERY — COLONOSCOPY WITH PROPOFOL
Anesthesia: General

## 2021-12-10 MED ORDER — LACTATED RINGERS IV SOLN: INTRAVENOUS | Status: DC

## 2021-12-10 MED ORDER — PROPOFOL 10 MG/ML IV BOLUS
INTRAVENOUS | Status: DC | PRN
  Administered 2021-12-10: 80 mg via INTRAVENOUS

## 2021-12-10 MED ORDER — PROPOFOL 500 MG/50ML IV EMUL
INTRAVENOUS | Status: DC | PRN
  Administered 2021-12-10: 150 ug/kg/min via INTRAVENOUS

## 2021-12-10 NOTE — H&P (Signed)
$'@LOGO'e$ @   Primary Care Physician:  Celene Squibb, MD Primary Gastroenterologist:  Dr. Gala Romney  Pre-Procedure History & Physical: HPI:  Evan Ayala is a 71 y.o. male is here for a screening colonoscopy.  negative colonoscopy 2011.  No bowel symptoms.  No family history of colon cancer.  Past Medical History:  Diagnosis Date   Cancer [facility])    Dysrhythmia    AFib   Hypercholesteremia    Persistent atrial fibrillation ([place]) 07/16/2020   Secondary hypercoagulable state ([place]) 07/16/2020    Past Surgical History:  Procedure Laterality Date   ATRIAL FIBRILLATION ABLATION N/A 06/18/2020   Procedure: ATRIAL FIBRILLATION ABLATION;  Surgeon: Constance Haw, MD;  Location: [facility];  Service: Cardiovascular;  Laterality: N/A;   CARDIOVERSION N/A 04/10/2020   Procedure: CARDIOVERSION;  Surgeon: Arnoldo Lenis, MD;  Location[facility];  Service: Endoscopy;  Laterality: N/A;   HERNIA REPAIR Left    JOINT REPLACEMENT     TOTAL KNEE ARTHROPLASTY Left     Prior to Admission medications   Medication Sig Start Date End Date Taking? Authorizing Provider  acetaminophen (TYLENOL) 650 MG CR tablet Take 1,300 mg by mouth every 8 (eight) hours as needed for pain.   Yes [provider]  ELIQUIS 5 MG TABS tablet Take 5 mg by mouth 2 (two) times daily. 03/10/20  Yes [provider]  furosemide (LASIX) 40 MG tablet Take 40 mg by mouth daily.   Yes [provider]  metoprolol succinate (TOPROL-XL) 50 MG 24 hr tablet Take 50 mg by mouth 2 (two) times daily. Take with or immediately following a meal.   Yes [provider]  polyethylene glycol-electrolytes (NULYTELY) 420 g solution As directed 10/30/21  Yes Cheyene Hamric, [name], MD  potassium chloride (KLOR-CON) 10 MEQ tablet Take 10 mEq by mouth daily. 03/10/20  Yes [provider]  rosuvastatin (CRESTOR) 10 MG tablet Take 10 mg by mouth at bedtime.  12/13/19  Yes [provider]     Allergies as of 10/30/2021 - Review Complete 10/27/2021  Allergen Reaction Noted   Penicillins Other (See Comments) 03/31/2020    Family History  Problem Relation Age of Onset   COPD Father     Social History   Socioeconomic History   Marital status: Married    Spouse name: Not on file   Number of children: Not on file   Years of education: Not on file   Highest education level: Not on file  Occupational History   Not on file  Tobacco Use   Smoking status: Never   Smokeless tobacco: Never  Vaping Use   Vaping Use: Never used  Substance and Sexual Activity   Alcohol use: Yes    Alcohol/week: 7.0 standard drinks    Types: 7 Glasses of wine per week    Comment: socially   Drug use: Never   Sexual activity: Yes  Other Topics Concern   Not on file  Social History Narrative   Not on file   Social Determinants of Health   Financial Resource Strain: Not on file  Food Insecurity: Not on file  Transportation Needs: Not on file  Physical Activity: Not on file  Stress: Not on file  Social Connections: Not on file  Intimate Partner Violence: Not on file    Review of Systems: See HPI, otherwise negative ROS  Physical Exam: BP (!) 155/90   Pulse 72   Temp 97.6 F (36.4 C) (Oral)  Resp 15   Ht '6\' 1"'$  (1.854 m)   Wt 96 kg   SpO2 100%   BMI 27.92 kg/m  General:   Alert,  Well-developed, well-nourished, pleasant and cooperative in NAD Head:  Normocephalic and atraumatic. ELungs:  Clear throughout to auscultation.   No wheezes, crackles, or rhonchi. No acute distress. Heart:  Regular rate and rhythm; no murmurs, clicks, rubs,  or gallops. Abdomen:  Soft, nontender and nondistended. No masses, hepatosplenomegaly or hernias noted. Normal bowel sounds, without guarding, and without rebound.   Impression/Plan: Evan Ayala is now here to undergo a screening colonoscopy.   average rescreening examination  Risks, benefits, limitations, imponderables and  alternatives regarding colonoscopy have been reviewed with the patient. Questions have been answered. All parties agreeable.     Notice:  This dictation was prepared with Dragon dictation along with smaller phrase technology. Any transcriptional errors that result from this process are unintentional and may not be corrected upon review.

## 2021-12-10 NOTE — Discharge Instructions (Addendum)
  Colonoscopy Discharge Instructions  Read the instructions outlined below and refer to this sheet in the next few weeks. These discharge instructions provide you with general information on caring for yourself after you leave the hospital. Your doctor may also give you specific instructions. While your treatment has been planned according to the most current medical practices available, unavoidable complications occasionally occur. If you have any problems or questions after discharge, call Dr. Gala Romney at [phone]. ACTIVITY You may resume your regular activity, but move at a slower pace for the next 24 hours.  Take frequent rest periods for the next 24 hours.  Walking will help get rid of the air and reduce the bloated feeling in your belly (abdomen).  No driving for 24 hours (because of the medicine (anesthesia) used during the test).   Do not sign any important legal documents or operate any machinery for 24 hours (because of the anesthesia used during the test).  NUTRITION Drink plenty of fluids.  You may resume your normal diet as instructed by your doctor.  Begin with a light meal and progress to your normal diet. Heavy or fried foods are harder to digest and may make you feel sick to your stomach (nauseated).  Avoid alcoholic beverages for 24 hours or as instructed.  MEDICATIONS You may resume your normal medications unless your doctor tells you otherwise.  WHAT YOU CAN EXPECT TODAY Some feelings of bloating in the abdomen.  Passage of more gas than usual.  Spotting of blood in your stool or on the toilet paper.  IF YOU HAD POLYPS REMOVED DURING THE COLONOSCOPY: No aspirin products for 7 days or as instructed.  No alcohol for 7 days or as instructed.  Eat a soft diet for the next 24 hours.  FINDING OUT THE RESULTS OF YOUR TEST Not all test results are available during your visit. If your test results are not back during the visit, make an appointment with your caregiver to find out the  results. Do not assume everything is normal if you have not heard from your caregiver or the medical facility. It is important for you to follow up on all of your test results.  SEEK IMMEDIATE MEDICAL ATTENTION IF: You have more than a spotting of blood in your stool.  Your belly is swollen (abdominal distention).  You are nauseated or vomiting.  You have a temperature over 101.  You have abdominal pain or discomfort that is severe or gets worse throughout the day.       Diverticulosis information provided  No polyps found today  A future colonoscopy is not recommended unless new symptoms develop  At patient request, I called Jaasiel [place] at [phone] -  rolled to voicemail.  Left a message with findings and recommendations  Resume Eliquis today.

## 2021-12-10 NOTE — Op Note (Signed)
[facility] ([facility] Patient Name: Evan Ayala Procedure Date: 12/10/2021 1:40 PM MRN: [id number] Date of Birth: 1950/07/24 Attending MD: Norvel Richards , MD CSN: [id number] Age: 71 Admit Type: Outpatient Procedure:                Colonoscopy Indications:              Screening for colorectal malignant neoplasm Providers:                Norvel Richards, MD, Janeece Riggers, RN, Randa Spike, Technician Referring MD:              Medicines:                Propofol per Anesthesia Complications:            No immediate complications. Estimated Blood Loss:     Estimated blood loss: none. Estimated blood loss:                            none. Procedure:                Pre-Anesthesia Assessment:                           - Prior to the procedure, a History and Physical                            was performed, and patient medications and                            allergies were reviewed. The patient's tolerance of                            previous anesthesia was also reviewed. The risks                            and benefits of the procedure and the sedation                            options and risks were discussed with the patient.                            All questions were answered, and informed consent                            was obtained. Prior Anticoagulants: The patient has                            taken no previous anticoagulant or antiplatelet                            agents. ASA Grade Assessment: III - A patient with  severe systemic disease. After reviewing the risks                            and benefits, the patient was deemed in                            satisfactory condition to undergo the procedure.                           After obtaining informed consent, the colonoscope                            was passed under direct vision. Throughout the                            procedure, the patient's  blood pressure, pulse, and                            oxygen saturations were monitored continuously. The                            [id number]) scope was introduced through the                            anus and advanced to the the cecum, identified by                            appendiceal orifice and ileocecal valve. The                            colonoscopy was performed without difficulty. The                            patient tolerated the procedure well. The quality                            of the bowel preparation was adequate. Scope In: 1:54:46 PM Scope Out: 2:05:56 PM Scope Withdrawal Time: 0 hours 6 minutes 14 seconds  Total Procedure Duration: 0 hours 11 minutes 10 seconds  Findings:      The perianal and digital rectal examinations were normal.      Scattered medium-mouthed diverticula were found in the sigmoid colon and       descending colon.      The exam was otherwise without abnormality on direct and retroflexion       views. Impression:               - Diverticulosis in the sigmoid colon and in the                            descending colon.                           - The examination was otherwise normal on direct  and retroflexion views.                           - No specimens collected. Moderate Sedation:      Moderate (conscious) sedation was personally administered by an       anesthesia professional. The following parameters were monitored: oxygen       saturation, heart rate, blood pressure, respiratory rate, EKG, adequacy       of pulmonary ventilation, and response to care. Recommendation:           - Patient has a contact number available for                            emergencies. The signs and symptoms of potential                            delayed complications were discussed with the                            patient. Return to normal activities tomorrow.                            Written discharge  instructions were provided to the                            patient.                           - Resume previous diet.                           - Continue present medications. Resume Eliquis.                            Today                           - No repeat colonoscopy due to age. Procedure Code(s):        --- Professional ---                           [phone], Colonoscopy, flexible; diagnostic, including                            collection of specimen(s) by brushing or washing,                            when performed (separate procedure) Diagnosis Code(s):        --- Professional ---                           Z12.11, Encounter for screening for malignant                            neoplasm of colon  K57.30, Diverticulosis of large intestine without                            perforation or abscess without bleeding CPT copyright 2019 American Medical Association. All rights reserved. The codes documented in this report are preliminary and upon coder review may  be revised to meet current compliance requirements. [name]. Jerald Villalona, MD Norvel Richards, MD 12/10/2021 2:13:30 PM This report has been signed electronically. Number of Addenda: 0

## 2021-12-10 NOTE — Transfer of Care (Signed)
Immediate Anesthesia Transfer of Care Note  Patient: Evan Ayala  Procedure(s) Performed: COLONOSCOPY WITH PROPOFOL  Patient Location: PACU  Anesthesia Type:General  Level of Consciousness: awake, alert  and oriented  Airway & Oxygen Therapy: Patient Spontanous Breathing  Post-op Assessment: Report given to RN, Post -op Vital signs reviewed and stable, Patient moving all extremities X 4 and Patient able to stick tongue midline  Post vital signs: Reviewed  Last Vitals:  Vitals Value Taken Time  BP 121/66   Temp 98.6   Pulse 59   Resp 11   SpO2 93     Last Pain:  Vitals:   12/10/21 1311  TempSrc:   PainSc: 0-No pain      Patients Stated Pain Goal: 9 (35/52/17 4715)  Complications: No notable events documented.

## 2021-12-10 NOTE — Anesthesia Postprocedure Evaluation (Signed)
Anesthesia Post Note  Patient: Evan Ayala  Procedure(s) Performed: COLONOSCOPY WITH PROPOFOL  Patient location during evaluation: Phase II Anesthesia Type: General Level of consciousness: awake and alert and oriented Pain management: pain level controlled Vital Signs Assessment: post-procedure vital signs reviewed and stable Respiratory status: spontaneous breathing, nonlabored ventilation and respiratory function stable Cardiovascular status: blood pressure returned to baseline and stable Postop Assessment: no apparent nausea or vomiting Anesthetic complications: no   No notable events documented.   Last Vitals:  Vitals:   12/10/21 1302 12/10/21 1419  BP: (!) 155/90 121/66  Pulse: 72 (!) 58  Resp: 15 14  Temp: 36.4 C (!) 36.3 C  SpO2: 100% 96%    Last Pain:  Vitals:   12/10/21 1419  TempSrc: Axillary  PainSc:                  Finn Amos C Cordney Barstow

## 2021-12-10 NOTE — Anesthesia Preprocedure Evaluation (Signed)
Anesthesia Evaluation  Patient identified by MRN, date of birth, ID band Patient awake    Reviewed: Allergy & Precautions, NPO status , Patient's Chart, lab work & pertinent test results  Airway Mallampati: II  TM Distance: >3 FB Neck ROM: Full    Dental  (+) Dental Advisory Given, Caps   Pulmonary neg pulmonary ROS,    Pulmonary exam normal breath sounds clear to auscultation       Cardiovascular Exercise Tolerance: Good hypertension, + CAD and +CHF  Normal cardiovascular exam+ dysrhythmias Atrial Fibrillation  Rhythm:Regular Rate:Normal     Neuro/Psych negative neurological ROS  negative psych ROS   GI/Hepatic negative GI ROS, Neg liver ROS,   Endo/Other  negative endocrine ROS  Renal/GU negative Renal ROS  negative genitourinary   Musculoskeletal negative musculoskeletal ROS (+)   Abdominal   Peds negative pediatric ROS (+)  Hematology negative hematology ROS (+)   Anesthesia Other Findings   Reproductive/Obstetrics negative OB ROS                            Anesthesia Physical Anesthesia Plan  ASA: 3  Anesthesia Plan: General   Post-op Pain Management: Minimal or no pain anticipated   Induction: Intravenous  PONV Risk Score and Plan: Propofol infusion  Airway Management Planned: Nasal Cannula and Natural Airway  Additional Equipment:   Intra-op Plan:   Post-operative Plan:   Informed Consent: I have reviewed the patients History and Physical, chart, labs and discussed the procedure including the risks, benefits and alternatives for the proposed anesthesia with the patient or authorized representative who has indicated his/her understanding and acceptance.     Dental advisory given  Plan Discussed with: CRNA and Surgeon  Anesthesia Plan Comments:         Anesthesia Quick Evaluation

## 2021-12-17 ENCOUNTER — Encounter (HOSPITAL_COMMUNITY): Payer: Self-pay | Admitting: Internal Medicine

## 2021-12-30 NOTE — Progress Notes (Signed)
Cardiology Office Note Date:  12/30/2021  Patient ID:  Evan Ayala, Evan Ayala [birth year], MRN [id number] PCP:  Celene Squibb, MD  Cardiologist:  Dr. Harrington Challenger Electrophysiologist: Dr. Curt Bears    Chief Complaint: 6 mo  History of Present Illness: Evan Ayala is a 71 y.o. male with history of HTN, HLD, chronic CHF (systolic suspect tachy-mediated with improved EF), Afib  He comes in today to be seen for Dr. Curt Bears, last seen by him June 2022, doing well, no symptoms of his AFib, more active working around the house and garden.  No changes were made.  I saw him Dec 2022 He is doing great Rides an exercise bike an hour a day, has a young lab puppy that he walks 1-75mles most days Has 30 acres that he tends to , cuts hay, feels like his exertional capacity is excellent No CP, no  SOB He had one episode of Afib about 562mogo, lasted about an hour. NO near syncope or syncope. He had a melanoma taken off several months ago and bled quite abit after that, had to get it cauterized to stop.   Otherwise, no bleeding or signs of bleeding Asked to keep an eye on his BP No changes were made, planned for 6 mo visit  TODAY He continues to do great Got another lab puppy, walks them both now rides his bike, takes care of the property and garden No CP, palpitations or cardiac awareness. No SOB, DOE No dizzy spells, near syncope or syncope. No bleeding or signs of bleeding  Afib Hx Diagnosed Sept 2021 PVI ablation 06/18/2020  AAD None to date  Past Medical History:  Diagnosis Date   [facility]   Dysrhythmia    AFib   Hypercholesteremia    Persistent atrial fibrillation ([facility]/29/2021   Secondary hypercoagulable state ([facility]/2021    Past Surgical History:  Procedure Laterality Date   ATRIAL FIBRILLATION ABLATION N/A 06/18/2020   Procedure: ATRIAL FIBRILLATION ABLATION;  Surgeon: CaConstance HawMD;  [facility];  Service: Cardiovascular;  Laterality: N/A;    CARDIOVERSION N/A 04/10/2020   Procedure: CARDIOVERSION;  Surgeon: BrArnoldo LenisMD;  [facility];  Service: Endoscopy;  Laterality: N/A;   COLONOSCOPY WITH PROPOFOL N/A 12/10/2021   Procedure: COLONOSCOPY WITH PROPOFOL;  Surgeon: RoDaneil DolinMD;  Location: [facility];  Service: Endoscopy;  Laterality: N/A;  2:15pm   HERNIA REPAIR Left    JOINT REPLACEMENT     TOTAL KNEE ARTHROPLASTY Left     Current Outpatient Medications  Medication Sig Dispense Refill   acetaminophen (TYLENOL) 650 MG CR tablet Take 1,300 mg by mouth every 8 (eight) hours as needed for pain.     ELIQUIS 5 MG TABS tablet Take 5 mg by mouth 2 (two) times daily.     furosemide (LASIX) 40 MG tablet Take 40 mg by mouth daily.     metoprolol succinate (TOPROL-XL) 50 MG 24 hr tablet Take 50 mg by mouth 2 (two) times daily. Take with or immediately following a meal.     polyethylene glycol-electrolytes (NULYTELY) 420 g solution As directed 4000 mL 0   potassium chloride (KLOR-CON) 10 MEQ tablet Take 10 mEq by mouth daily.     rosuvastatin (CRESTOR) 10 MG tablet Take 10 mg by mouth at bedtime.      No current facility-administered medications for this visit.    Allergies:   Penicillins   Social History:  The  patient  reports that he has never smoked. He has never used smokeless tobacco. He reports current alcohol use of about 7.0 standard drinks of alcohol per week. He reports that he does not use drugs.   Family History:  The patient's family history includes COPD in his father.  ROS:  Please see the history of present illness.    All other systems are reviewed and otherwise negative.   PHYSICAL EXAM:  VS:  There were no vitals taken for this visit. BMI: There is no height or weight on file to calculate BMI. Well nourished, well developed, in no acute distress HEENT: normocephalic, atraumatic Neck: no JVD, carotid bruits or masses Cardiac:   RRR; no significant murmurs, no rubs, or  gallops Lungs:  CTA b/l, no wheezing, rhonchi or rales Abd: soft, nontender MS: no deformity or atrophy Ext: no edema Skin: warm and dry, no rash Neuro:  No gross deficits appreciated Psych: euthymic mood, full affect   EKG:  EKG done today and reviewed by myself: SR 64bpm, T changes are unchanged from prior  TTE 10/08/2020 Review of the above records today demonstrates:   1. Left ventricular ejection fraction, by estimation, is 55 to 60%. The  left ventricle has normal function. The left ventricle has no regional  wall motion abnormalities. Left ventricular diastolic parameters are  consistent with Grade I diastolic  dysfunction (impaired relaxation).   2. Right ventricular systolic function is normal. The right ventricular  size is normal.   3. Left atrial size was severely dilated.   4. Right atrial size was moderately dilated.   5. The mitral valve is normal in structure. Mild mitral valve  regurgitation. No evidence of mitral stenosis.   6. The aortic valve is tricuspid. Aortic valve regurgitation is not  visualized. No aortic stenosis is present.   7. The inferior vena cava is normal in size with greater than 50%  respiratory variability, suggesting right atrial pressure of 3 mmHg  06/18/2020: EPS/Ablation CONCLUSIONS: 1. Atrial fibrillation upon presentation.   2. Successful electrical isolation and anatomical encircling of all four pulmonary veins with radiofrequency current.  A WACA approach was used 3. Additional left atrial ablation was performed with a standard box lesion created along the posterior wall of the left atrium 4. Atrial fibrillation successfully cardioverted to sinus rhythm. 5. No early apparent complications.   03/26/20: TTE IMPRESSIONS   1. Left ventricular ejection fraction, by estimation, is 30 to 35%. The  left ventricle has moderately decreased function. The left ventricle  demonstrates global hypokinesis. Left ventricular diastolic parameters  are  indeterminate.   2. Right ventricular systolic function is normal. The right ventricular  size is normal. There is normal pulmonary artery systolic pressure.   3. Left atrial size was severely dilated.   4. Right atrial size was mildly dilated.   5. The mitral valve is normal in structure. Mild mitral valve  regurgitation. No evidence of mitral stenosis.   6. The aortic valve is tricuspid. There is mild calcification of the  aortic valve. There is mild thickening of the aortic valve. Aortic valve  regurgitation is trivial. No aortic stenosis is present.   7. The inferior vena cava is normal in size with greater than 50%  respiratory variability, suggesting right atrial pressure of 3 mmHg.   Recent Labs: 12/07/2021: BUN 18; Creatinine, Ser 0.67; Potassium 4.5; Sodium 137  No results found for requested labs within last 365 days.   CrCl cannot be calculated (Patient's most  recent lab result is older than the maximum 21 days allowed.).   Wt Readings from Last 3 Encounters:  12/10/21 211 lb 10.3 oz (96 kg)  12/07/21 212 lb (96.2 kg)  10/27/21 216 lb (98 kg)     Other studies reviewed: Additional studies/records reviewed today include: summarized above  ASSESSMENT AND PLAN:  Persistent Afib CHA2DS2Vasc is  3, on Eliquis, appropriately dosed no burden by symptoms Sees his PMD and has labs done regularly there  NICM, Chronic CHF (systolic) Recovered LVEF Excellent exertional capacity No exam findings or symptoms of volume OL or clinic change  HTN Looks good    Disposition: F/u with [place] in 1 year sooner if needed  Current medicines are reviewed at length with the patient today.  The patient did not have any concerns regarding medicines.  Venetia Night, PA-C 12/30/2021 9:33 AM     [facility] [place] [address] [phone] (office)  [phone] (fax)

## 2021-12-31 ENCOUNTER — Encounter: Payer: Self-pay | Admitting: Physician Assistant

## 2021-12-31 ENCOUNTER — Ambulatory Visit (INDEPENDENT_AMBULATORY_CARE_PROVIDER_SITE_OTHER): Payer: Medicare Other | Admitting: Physician Assistant

## 2021-12-31 VITALS — BP 126/78 | HR 64 | Ht 73.0 in | Wt 213.0 lb

## 2021-12-31 DIAGNOSIS — R931 Abnormal findings on diagnostic imaging of heart and coronary circulation: Secondary | ICD-10-CM

## 2021-12-31 DIAGNOSIS — I428 Other cardiomyopathies: Secondary | ICD-10-CM

## 2021-12-31 DIAGNOSIS — I10 Essential (primary) hypertension: Secondary | ICD-10-CM

## 2021-12-31 DIAGNOSIS — I4819 Other persistent atrial fibrillation: Secondary | ICD-10-CM | POA: Diagnosis not present

## 2021-12-31 NOTE — Patient Instructions (Addendum)
Medication Instructions:   Your physician recommends that you continue on your current medications as directed. Please refer to the Current Medication list given to you today.  *If you need a refill on your cardiac medications before your next appointment, please call your pharmacy*   Lab Work: [place]    If you have labs (blood work) drawn today and your tests are completely normal, you will receive your results only by: Portal (if you have MyChart) OR A paper copy in the mail If you have any lab test that is abnormal or we need to change your treatment, we will call you to review the results.   Testing/Procedures: NONE ORDERED  TODAY    Follow-Up: At [facility], you and your health needs are our priority.  As part of our continuing mission to provide you with exceptional heart care, we have created designated Provider Care Teams.  These Care Teams include your primary Cardiologist (physician) and Advanced Practice Providers (APPs -  Physician Assistants and Nurse Practitioners) who all work together to provide you with the care you need, when you need it.  We recommend signing up for the patient portal called "MyChart".  Sign up information is provided on this After Visit Summary.  MyChart is used to connect with patients for Virtual Visits (Telemedicine).  Patients are able to view lab/test results, encounter notes, upcoming appointments, etc.  Non-urgent messages can be sent to your provider as well.   To learn more about what you can do with MyChart, go to [email or web address].    Your next appointment:   1 year(s)  The format for your next appointment:   In Person  Provider:    Dr. Curt Bears   Other Instructions   Important Information About [place]

## 2022-01-14 DIAGNOSIS — M25561 Pain in right knee: Secondary | ICD-10-CM | POA: Diagnosis not present

## 2022-01-14 DIAGNOSIS — Z96652 Presence of left artificial knee joint: Secondary | ICD-10-CM | POA: Diagnosis not present

## 2022-01-14 DIAGNOSIS — G8929 Other chronic pain: Secondary | ICD-10-CM | POA: Diagnosis not present

## 2022-01-14 DIAGNOSIS — M1711 Unilateral primary osteoarthritis, right knee: Secondary | ICD-10-CM | POA: Diagnosis not present

## 2022-01-25 DIAGNOSIS — R7301 Impaired fasting glucose: Secondary | ICD-10-CM | POA: Diagnosis not present

## 2022-01-25 DIAGNOSIS — Z125 Encounter for screening for malignant neoplasm of prostate: Secondary | ICD-10-CM | POA: Diagnosis not present

## 2022-01-25 DIAGNOSIS — E782 Mixed hyperlipidemia: Secondary | ICD-10-CM | POA: Diagnosis not present

## 2022-01-27 DIAGNOSIS — Z08 Encounter for follow-up examination after completed treatment for malignant neoplasm: Secondary | ICD-10-CM | POA: Diagnosis not present

## 2022-01-27 DIAGNOSIS — Z1283 Encounter for screening for malignant neoplasm of skin: Secondary | ICD-10-CM | POA: Diagnosis not present

## 2022-01-27 DIAGNOSIS — L570 Actinic keratosis: Secondary | ICD-10-CM | POA: Diagnosis not present

## 2022-01-27 DIAGNOSIS — Z85820 Personal history of malignant melanoma of skin: Secondary | ICD-10-CM | POA: Diagnosis not present

## 2022-01-27 DIAGNOSIS — X32XXXD Exposure to sunlight, subsequent encounter: Secondary | ICD-10-CM | POA: Diagnosis not present

## 2022-01-27 DIAGNOSIS — D225 Melanocytic nevi of trunk: Secondary | ICD-10-CM | POA: Diagnosis not present

## 2022-02-01 DIAGNOSIS — I10 Essential (primary) hypertension: Secondary | ICD-10-CM | POA: Diagnosis not present

## 2022-02-01 DIAGNOSIS — R7301 Impaired fasting glucose: Secondary | ICD-10-CM | POA: Diagnosis not present

## 2022-02-01 DIAGNOSIS — I5022 Chronic systolic (congestive) heart failure: Secondary | ICD-10-CM | POA: Diagnosis not present

## 2022-02-01 DIAGNOSIS — Z96652 Presence of left artificial knee joint: Secondary | ICD-10-CM | POA: Diagnosis not present

## 2022-02-01 DIAGNOSIS — Z6828 Body mass index (BMI) 28.0-28.9, adult: Secondary | ICD-10-CM | POA: Diagnosis not present

## 2022-02-01 DIAGNOSIS — Z1211 Encounter for screening for malignant neoplasm of colon: Secondary | ICD-10-CM | POA: Diagnosis not present

## 2022-02-01 DIAGNOSIS — I480 Paroxysmal atrial fibrillation: Secondary | ICD-10-CM | POA: Diagnosis not present

## 2022-02-01 DIAGNOSIS — E663 Overweight: Secondary | ICD-10-CM | POA: Diagnosis not present

## 2022-02-01 DIAGNOSIS — M545 Low back pain, unspecified: Secondary | ICD-10-CM | POA: Diagnosis not present

## 2022-02-01 DIAGNOSIS — Z85828 Personal history of other malignant neoplasm of skin: Secondary | ICD-10-CM | POA: Diagnosis not present

## 2022-02-01 DIAGNOSIS — M25561 Pain in right knee: Secondary | ICD-10-CM | POA: Diagnosis not present

## 2022-02-01 DIAGNOSIS — E782 Mixed hyperlipidemia: Secondary | ICD-10-CM | POA: Diagnosis not present

## 2022-02-05 ENCOUNTER — Other Ambulatory Visit: Payer: Self-pay | Admitting: Cardiology

## 2022-02-15 ENCOUNTER — Encounter: Payer: Self-pay | Admitting: Cardiology

## 2022-02-15 DIAGNOSIS — M1711 Unilateral primary osteoarthritis, right knee: Secondary | ICD-10-CM | POA: Diagnosis not present

## 2022-02-16 ENCOUNTER — Telehealth: Payer: Self-pay | Admitting: Nurse Practitioner

## 2022-02-16 NOTE — Telephone Encounter (Signed)
Created in error

## 2022-03-16 ENCOUNTER — Telehealth: Payer: Self-pay | Admitting: *Deleted

## 2022-03-16 NOTE — Telephone Encounter (Signed)
   Pre-operative Risk Assessment    Patient Name: Evan Ayala  DOB: 1951-03-19 MRN: [id number]     Request for Surgical Clearance    Procedure:  Dental Extraction - Amount of Teeth to be Pulled:  1  Date of Surgery:  Clearance TBD                                 Surgeon:  DR. [facility] Surgeon's Group or Practice Name:  [facility] Phone number:  [phone] Fax number:  [phone]   Type of Clearance Requested:   - Pharmacy:  Hold Apixaban (Eliquis) NOT INDICATED   Type of Anesthesia:  Not Indicated   Additional requests/questions:    Astrid Divine   03/16/2022, 7:40 AM

## 2022-03-16 NOTE — Telephone Encounter (Signed)
Notes faxed to surgeon. This phone note will be removed from the preop pool. Richardson Dopp, PA-C  03/16/2022 6:05 PM

## 2022-03-16 NOTE — Telephone Encounter (Signed)
   Patient Name:  Evan Ayala  DOB:  08-Jan-1951  MRN:  [id number]   Primary Cardiologist: Dorris Carnes, MD  Chart reviewed as part of pre-operative protocol coverage.   Simple dental extractions are considered low risk procedures per guidelines and generally do not require any specific cardiac clearance. It is also generally accepted that for simple extractions and dental cleanings, there is no need to interrupt blood thinner therapy.  SBE prophylaxis is not required for the patient from a cardiac standpoint. Do not hold Eliquis.  Please call with questions.  Richardson Dopp, PA-C 03/16/2022, 6:04 PM

## 2022-03-16 NOTE — Telephone Encounter (Signed)
ADDENDUM: REQUESTING OFFICE DID FAX OVER A REQUEST CLARIFYING THEY WILL NEED RECOMMENDATIONS IF ELIQUIS IF IT NEEDS TO BE HELD OR NOT.

## 2022-03-17 ENCOUNTER — Telehealth: Payer: Self-pay | Admitting: Cardiology

## 2022-03-17 NOTE — Telephone Encounter (Signed)
Notes faxed to surgeon at [phone] This phone note will be removed from the preop pool. Richardson Dopp, PA-C  03/17/2022 5:56 PM

## 2022-03-17 NOTE — Telephone Encounter (Signed)
Pt calling stating the dental office did not receive clearance form. He ask that you fax it again to [phone]

## 2022-04-16 DIAGNOSIS — M25561 Pain in right knee: Secondary | ICD-10-CM | POA: Diagnosis not present

## 2022-04-16 DIAGNOSIS — M25661 Stiffness of right knee, not elsewhere classified: Secondary | ICD-10-CM | POA: Diagnosis not present

## 2022-04-20 ENCOUNTER — Telehealth: Payer: Self-pay | Admitting: Internal Medicine

## 2022-04-20 NOTE — Telephone Encounter (Signed)
   Pre-operative Risk Assessment    Patient Name: Evan Ayala  DOB: [birth year] MRN: [id number]      Request for Surgical Clearance    Procedure:   Right total knee arthoplasty   Date of Surgery:  Clearance 05/11/22                                 Surgeon:  Dr. Kerby Nora Surgeon's Group or Practice Name:  [facility]  Phone number:  [phone] Fax number:  [phone]   Type of Clearance Requested:   - Pharmacy:  Hold Apixaban (Eliquis) TBD by cardiology   Type of Anesthesia:  Spinal   Additional requests/questions:    Crist Infante   04/20/2022, 11:59 AM

## 2022-04-22 ENCOUNTER — Telehealth: Payer: Self-pay | Admitting: *Deleted

## 2022-04-22 NOTE — Telephone Encounter (Signed)
Pt agreeable to plan of care tele pre op appt 05/04/22 @ 3 pm. Med rec and consent are done.

## 2022-04-22 NOTE — Telephone Encounter (Signed)
Pt agreeable to plan of care tele pre op appt 05/04/22 @ 3 pm. Med rec and consent are done.      Patient Consent for Virtual Visit        Evan Ayala has provided verbal consent on 04/22/2022 for a virtual visit (video or telephone).   CONSENT FOR VIRTUAL VISIT FOR:  Evan Ayala  By participating in this virtual visit I agree to the following:  I hereby voluntarily request, consent and authorize [facility] and its employed or contracted physicians, physician assistants, nurse practitioners or other licensed health care professionals (the Practitioner), to provide me with telemedicine health care services (the "Services") as deemed necessary by the treating Practitioner. I acknowledge and consent to receive the Services by the Practitioner via telemedicine. I understand that the telemedicine visit will involve communicating with the Practitioner through live audiovisual communication technology and the disclosure of certain medical information by electronic transmission. I acknowledge that I have been given the opportunity to request an in-person assessment or other available alternative prior to the telemedicine visit and am voluntarily participating in the telemedicine visit.  I understand that I have the right to withhold or withdraw my consent to the use of telemedicine in the course of my care at any time, without affecting my right to future care or treatment, and that the Practitioner or I may terminate the telemedicine visit at any time. I understand that I have the right to inspect all information obtained and/or recorded in the course of the telemedicine visit and may receive copies of available information for a reasonable fee.  I understand that some of the potential risks of receiving the Services via telemedicine include:  Delay or interruption in medical evaluation due to technological equipment failure or disruption; Information transmitted may not be sufficient  (e.g. poor resolution of images) to allow for appropriate medical decision making by the Practitioner; and/or  In rare instances, security protocols could fail, causing a breach of personal health information.  Furthermore, I acknowledge that it is my responsibility to provide information about my medical history, conditions and care that is complete and accurate to the best of my ability. I acknowledge that Practitioner's advice, recommendations, and/or decision may be based on factors not within their control, such as incomplete or inaccurate data provided by me or distortions of diagnostic images or specimens that may result from electronic transmissions. I understand that the practice of medicine is not an exact science and that Practitioner makes no warranties or guarantees regarding treatment outcomes. I acknowledge that a copy of this consent can be made available to me via my patient portal ([facility]), or I can request a printed copy by calling the office of [facility].    I understand that my insurance will be billed for this visit.   I have read or had this consent read to me. I understand the contents of this consent, which adequately explains the benefits and risks of the Services being provided via telemedicine.  I have been provided ample opportunity to ask questions regarding this consent and the Services and have had my questions answered to my satisfaction. I give my informed consent for the services to be provided through the use of telemedicine in my medical care

## 2022-04-22 NOTE — Telephone Encounter (Signed)
   Name: Evan Ayala  DOB: Jun 10, 1951  MRN: [id number]  Primary Cardiologist: Dorris Carnes, MD   Preoperative team, please contact this patient and set up a phone call appointment for further preoperative risk assessment. Please obtain consent and complete medication review. Thank you for your help.  I confirm that guidance regarding antiplatelet and oral anticoagulation therapy has been completed and, if necessary, noted below.  Patient with diagnosis of atrial fibrillaton on Eliquis for anticoagulation.     Procedure: right total knee arthroplast Date of procedure: 05/11/22     CHA2DS2-VASc Score = 4   This indicates a 4.8% annual risk of stroke. The patient's score is based upon: CHF History: 1 HTN History: 1 Diabetes History: 0 Stroke History: 0 Vascular Disease History: 1 Age Score: 1 Gender Score: 0   CrCl 136 Platelet count 199   Per office protocol, patient can hold Eliquis for 3 days prior to procedure.   Patient will not need bridging with Lovenox (enoxaparin) around procedure.   Deberah Pelton, NP 04/22/2022, 12:33 PM [place]

## 2022-04-22 NOTE — Telephone Encounter (Signed)
Patient with diagnosis of atrial fibrillaton on Eliquis for anticoagulation.    Procedure: right total knee arthroplast Date of procedure: 05/11/22   CHA2DS2-VASc Score = 4   This indicates a 4.8% annual risk of stroke. The patient's score is based upon: CHF History: 1 HTN History: 1 Diabetes History: 0 Stroke History: 0 Vascular Disease History: 1 Age Score: 1 Gender Score: 0   CrCl 136 Platelet count 199  Per office protocol, patient can hold Eliquis for 3 days prior to procedure.   Patient will not need bridging with Lovenox (enoxaparin) around procedure.  **This guidance is not considered finalized until pre-operative APP has relayed final recommendations.**

## 2022-04-24 DIAGNOSIS — Z23 Encounter for immunization: Secondary | ICD-10-CM | POA: Diagnosis not present

## 2022-05-04 ENCOUNTER — Ambulatory Visit: Payer: Medicare Other | Attending: Cardiovascular Disease | Admitting: Nurse Practitioner

## 2022-05-04 DIAGNOSIS — Z01810 Encounter for preprocedural cardiovascular examination: Secondary | ICD-10-CM

## 2022-05-04 NOTE — Progress Notes (Signed)
Virtual Visit via Telephone Note   Because of Evan Ayala's co-morbid illnesses, he is at least at moderate risk for complications without adequate follow up.  This format is felt to be most appropriate for this patient at this time.  The patient did not have access to video technology/had technical difficulties with video requiring transitioning to audio format only (telephone).  All issues noted in this document were discussed and addressed.  No physical exam could be performed with this format.  Please refer to the patient's chart for his consent to telehealth for [facility].  Evaluation Performed:  Preoperative cardiovascular risk assessment _____________   Date:  05/04/2022   Patient ID:  Evan Ayala, DOB [birth year], MRN [id number] Patient Location:  Home Provider location:   Office  Primary Care Provider:  Celene Squibb, MD Primary Cardiologist:  Dorris Carnes, MD  Chief Complaint / Patient Profile   71 y.o. y/o male with a h/o persistent atrial fibrillation, chronic systolic heart failure with recovered EF, NICM, hypertension, and hyperlipidemia who is pending to right total knee arthroplasty on 05/11/2022 with Dr. Kerby Nora of [facility] and presents today for telephonic preoperative cardiovascular risk assessment.  Past Medical History    Past Medical History:  Diagnosis Date   Cancer [facility])    Dysrhythmia    AFib   Hypercholesteremia    Persistent atrial fibrillation ([place]) 07/16/2020   Secondary hypercoagulable state ([place]) 07/16/2020   Past Surgical History:  Procedure Laterality Date   ATRIAL FIBRILLATION ABLATION N/A 06/18/2020   Procedure: ATRIAL FIBRILLATION ABLATION;  Surgeon: Constance Haw, MD;  Location: [place] CV LAB;  Service: Cardiovascular;  Laterality: N/A;   CARDIOVERSION N/A 04/10/2020   Procedure: CARDIOVERSION;  Surgeon: Arnoldo Lenis, MD;  [facility];  Service: Endoscopy;  Laterality: N/A;    COLONOSCOPY WITH PROPOFOL N/A 12/10/2021   Procedure: COLONOSCOPY WITH PROPOFOL;  Surgeon: Daneil Dolin, MD;  [facility];  Service: Endoscopy;  Laterality: N/A;  2:15pm   HERNIA REPAIR Left    JOINT REPLACEMENT     TOTAL KNEE ARTHROPLASTY Left     Allergies  Allergies  Allergen Reactions   Penicillins Other (See Comments)    Unknown childhood reaction.    History of Present Illness    Evan Ayala is a 71 y.o. male who presents via audio/video conferencing for a telehealth visit today.  Pt was last seen in cardiology clinic on 12/31/2021 by Tommye Standard, PA.  At that time Evan Ayala was doing well. The patient is now pending procedure as outlined above. Since his last visit, he has done well from a cardiac standpoint. He denies chest pain, palpitations, dyspnea, pnd, orthopnea, n, v, dizziness, syncope, edema, weight gain, or early satiety. All other systems reviewed and are otherwise negative except as noted above.   Home Medications    Prior to Admission medications   Medication Sig Start Date End Date Taking? Authorizing Provider  acetaminophen (TYLENOL) 650 MG CR tablet Take 1,300 mg by mouth every 8 (eight) hours as needed for pain.    [provider]  ELIQUIS 5 MG TABS tablet Take 5 mg by mouth 2 (two) times daily. 03/10/20   [provider]  furosemide (LASIX) 40 MG tablet Take 40 mg by mouth daily.    [provider]  metoprolol succinate (TOPROL-XL) 50 MG 24 hr tablet Take 1 tablet (50 mg total) by mouth 2 (two) times daily. With or  immediately following a meal 02/05/22   Camnitz, Ocie Doyne, MD  potassium chloride (KLOR-CON) 10 MEQ tablet Take 10 mEq by mouth daily. 03/10/20   [provider]  rosuvastatin (CRESTOR) 10 MG tablet Take 10 mg by mouth at bedtime.  12/13/19   [provider]    Physical Exam    Vital Signs:  Evan Ayala does not have vital signs available for review today.  Given telephonic  nature of communication, physical exam is limited. AAOx3. NAD. Normal affect.  Speech and respirations are unlabored.  Accessory Clinical Findings    None  Assessment & Plan    1.  Preoperative Cardiovascular Risk Assessment:  According to the Revised Cardiac Risk Index (RCRI), his Perioperative Risk of Major Cardiac Event is (%): 0.9. His Functional Capacity in METs is: 8.97 according to the Duke Activity Status Index (DASI).Therefore, based on ACC/AHA guidelines, patient would be at acceptable risk for the planned procedure without further cardiovascular testing.  The patient was advised that if he develops new symptoms prior to surgery to contact our office to arrange for a follow-up visit, and he verbalized understanding.  Per office protocol, patient can hold Eliquis for 3 days prior to procedure. Patient will not need bridging with Lovenox (enoxaparin) around procedure.  Resume Eliquis as soon as possible postprocedure, at the discretion of the surgeon.  A copy of this note will be routed to requesting surgeon.  Time:   Today, I have spent 4 minutes with the patient with telehealth technology discussing medical history, symptoms, and management plan.     Evan Sciara, NP  05/04/2022, 3:10 PM

## 2022-05-07 ENCOUNTER — Telehealth: Payer: Self-pay | Admitting: Internal Medicine

## 2022-05-07 NOTE — Telephone Encounter (Signed)
Margreta Journey, from [facility] is calling about this patient's preop clearance.

## 2022-05-07 NOTE — Telephone Encounter (Signed)
Requesting office called about clearance. Pt has been cleared. See notes from Diona Browner, NP

## 2022-05-11 DIAGNOSIS — Z7982 Long term (current) use of aspirin: Secondary | ICD-10-CM | POA: Diagnosis not present

## 2022-05-11 DIAGNOSIS — G8918 Other acute postprocedural pain: Secondary | ICD-10-CM | POA: Diagnosis not present

## 2022-05-11 DIAGNOSIS — I10 Essential (primary) hypertension: Secondary | ICD-10-CM | POA: Diagnosis not present

## 2022-05-11 DIAGNOSIS — I509 Heart failure, unspecified: Secondary | ICD-10-CM | POA: Diagnosis not present

## 2022-05-11 DIAGNOSIS — D649 Anemia, unspecified: Secondary | ICD-10-CM | POA: Diagnosis not present

## 2022-05-11 DIAGNOSIS — I110 Hypertensive heart disease with heart failure: Secondary | ICD-10-CM | POA: Diagnosis not present

## 2022-05-11 DIAGNOSIS — Z7901 Long term (current) use of anticoagulants: Secondary | ICD-10-CM | POA: Diagnosis not present

## 2022-05-11 DIAGNOSIS — E785 Hyperlipidemia, unspecified: Secondary | ICD-10-CM | POA: Diagnosis not present

## 2022-05-11 DIAGNOSIS — Z96652 Presence of left artificial knee joint: Secondary | ICD-10-CM | POA: Diagnosis not present

## 2022-05-11 DIAGNOSIS — M1711 Unilateral primary osteoarthritis, right knee: Secondary | ICD-10-CM | POA: Diagnosis not present

## 2022-05-11 DIAGNOSIS — I4891 Unspecified atrial fibrillation: Secondary | ICD-10-CM | POA: Diagnosis not present

## 2022-05-12 DIAGNOSIS — I4891 Unspecified atrial fibrillation: Secondary | ICD-10-CM | POA: Diagnosis not present

## 2022-05-12 DIAGNOSIS — M1711 Unilateral primary osteoarthritis, right knee: Secondary | ICD-10-CM | POA: Diagnosis not present

## 2022-05-12 DIAGNOSIS — I110 Hypertensive heart disease with heart failure: Secondary | ICD-10-CM | POA: Diagnosis not present

## 2022-05-12 DIAGNOSIS — I509 Heart failure, unspecified: Secondary | ICD-10-CM | POA: Diagnosis not present

## 2022-05-12 DIAGNOSIS — E785 Hyperlipidemia, unspecified: Secondary | ICD-10-CM | POA: Diagnosis not present

## 2022-05-12 DIAGNOSIS — D649 Anemia, unspecified: Secondary | ICD-10-CM | POA: Diagnosis not present

## 2022-05-14 DIAGNOSIS — R269 Unspecified abnormalities of gait and mobility: Secondary | ICD-10-CM | POA: Diagnosis not present

## 2022-05-14 DIAGNOSIS — M25561 Pain in right knee: Secondary | ICD-10-CM | POA: Diagnosis not present

## 2022-05-14 DIAGNOSIS — M25661 Stiffness of right knee, not elsewhere classified: Secondary | ICD-10-CM | POA: Diagnosis not present

## 2022-05-17 ENCOUNTER — Encounter: Payer: Self-pay | Admitting: Cardiology

## 2022-05-17 DIAGNOSIS — M25561 Pain in right knee: Secondary | ICD-10-CM | POA: Diagnosis not present

## 2022-05-17 DIAGNOSIS — R269 Unspecified abnormalities of gait and mobility: Secondary | ICD-10-CM | POA: Diagnosis not present

## 2022-05-17 DIAGNOSIS — M25661 Stiffness of right knee, not elsewhere classified: Secondary | ICD-10-CM | POA: Diagnosis not present

## 2022-05-18 ENCOUNTER — Ambulatory Visit (HOSPITAL_COMMUNITY)
Admission: RE | Admit: 2022-05-18 | Discharge: 2022-05-18 | Disposition: A | Discharge status: Home / Self Care | Payer: Medicare Other | Source: Ambulatory Visit | Attending: Nurse Practitioner | Admitting: Nurse Practitioner

## 2022-05-18 ENCOUNTER — Encounter: Payer: Self-pay | Admitting: Cardiology

## 2022-05-18 VITALS — BP 136/66 | HR 162 | Ht 73.0 in | Wt 213.6 lb

## 2022-05-18 DIAGNOSIS — E785 Hyperlipidemia, unspecified: Secondary | ICD-10-CM | POA: Diagnosis not present

## 2022-05-18 DIAGNOSIS — I4819 Other persistent atrial fibrillation: Secondary | ICD-10-CM | POA: Diagnosis not present

## 2022-05-18 DIAGNOSIS — I110 Hypertensive heart disease with heart failure: Secondary | ICD-10-CM | POA: Insufficient documentation

## 2022-05-18 DIAGNOSIS — D6869 Other thrombophilia: Secondary | ICD-10-CM | POA: Insufficient documentation

## 2022-05-18 DIAGNOSIS — Z7901 Long term (current) use of anticoagulants: Secondary | ICD-10-CM | POA: Insufficient documentation

## 2022-05-18 DIAGNOSIS — Z79899 Other long term (current) drug therapy: Secondary | ICD-10-CM | POA: Diagnosis not present

## 2022-05-18 NOTE — Progress Notes (Signed)
Primary Care Physician: Celene Squibb, MD Primary Cardiologist: Dr Harrington Challenger Primary Electrophysiologist: Dr Curt Bears Referring Physician: Dr Claria Dice is a 71 y.o. male with a history of HTN, HLD, chronic systolic CHF, and atrial fibrillation who presents for follow up in the [facility].  The patient was initially diagnosed with atrial fibrillation 03/2020. Echo 03/26/20 showed EF 30-35%. He underwent DCCV on 04/10/20 but unfortunately was back in afib on follow up 10/14. Patient is on Eliquis for a CHADS2VASC score of 3. He underwent afib ablation with Dr Curt Bears on 06/18/20. He reports that he has has some episodes of afib post ablation, mostly within the first 1-2 weeks post ablation. He has been mostly in SR over the last week. He denies any CP, swallowing, or groin issues.   Pt is now being seen in the afib clinic, 05/18/22 for return of afib after knee replacement 05/11/22. He has felt out of rhythm for 24 hours.. His ekg shows afib at 161 v rate. He is not symptomatic with this other than a jumpy feeling in his chest. He denies chest pain, shortness of breath, pre syncope. He is afebrile at 98.8. No fever or chills at home. He has been out of rhythm x 24 hours. He missed anticoagulation prior to knee surgery. No unusual warmth or swelling  or redness of rt knee  Today, he denies symptoms of chest pain, shortness of breath, orthopnea, PND, lower extremity edema, dizziness, presyncope, syncope, snoring, daytime somnolence, bleeding, or neurologic sequela. The patient is tolerating medications without difficulties and is otherwise without complaint today.    Atrial Fibrillation Risk Factors:  he does not have symptoms or diagnosis of sleep apnea. he does not have a history of rheumatic fever.   he has a BMI of There is no height or weight on file to calculate BMI.. There were no vitals filed for this visit.   Family History  Problem Relation Age of  Onset   COPD Father      Atrial Fibrillation Management history:  Previous antiarrhythmic drugs: none Previous cardioversions: 04/10/20 Previous ablations: 06/18/20 CHADS2VASC score: 3 Anticoagulation history: Eliquis   Past Medical History:  Diagnosis Date   Cancer ([place])    Dysrhythmia    AFib   Hypercholesteremia    Persistent atrial fibrillation ([place]) 07/16/2020   Secondary hypercoagulable state ([place]) 07/16/2020   Past Surgical History:  Procedure Laterality Date   ATRIAL FIBRILLATION ABLATION N/A 06/18/2020   Procedure: ATRIAL FIBRILLATION ABLATION;  Surgeon: Constance Haw, MD;  Location: [place] [facility];  Service: Cardiovascular;  Laterality: N/A;   CARDIOVERSION N/A 04/10/2020   Procedure: CARDIOVERSION;  Surgeon: Arnoldo Lenis, MD;  [facility];  Service: Endoscopy;  Laterality: N/A;   COLONOSCOPY WITH PROPOFOL N/A 12/10/2021   Procedure: COLONOSCOPY WITH PROPOFOL;  Surgeon: Daneil Dolin, MD;  [facility];  Service: Endoscopy;  Laterality: N/A;  2:15pm   HERNIA REPAIR Left    JOINT REPLACEMENT     TOTAL KNEE ARTHROPLASTY Left     Current Outpatient Medications  Medication Sig Dispense Refill   acetaminophen (TYLENOL) 650 MG CR tablet Take 1,300 mg by mouth every 8 (eight) hours as needed for pain.     ELIQUIS 5 MG TABS tablet Take 5 mg by mouth 2 (two) times daily.     furosemide (LASIX) 40 MG tablet Take 40 mg by mouth daily.     metoprolol succinate (TOPROL-XL)  50 MG 24 hr tablet Take 1 tablet (50 mg total) by mouth 2 (two) times daily. With or immediately following a meal 180 tablet 3   potassium chloride (KLOR-CON) 10 MEQ tablet Take 10 mEq by mouth daily.     rosuvastatin (CRESTOR) 10 MG tablet Take 10 mg by mouth at bedtime.      No current facility-administered medications for this encounter.    Allergies  Allergen Reactions   Penicillins Other (See Comments)    Unknown childhood reaction.    Social History    Socioeconomic History   Marital status: Married    Spouse name: Not on file   Number of children: Not on file   Years of education: Not on file   Highest education level: Not on file  Occupational History   Not on file  Tobacco Use   Smoking status: Never   Smokeless tobacco: Never  Vaping Use   Vaping Use: Never used  Substance and Sexual Activity   Alcohol use: Yes    Alcohol/week: 7.0 standard drinks of alcohol    Types: 7 Glasses of wine per week    Comment: socially   Drug use: Never   Sexual activity: Yes  Other Topics Concern   Not on file  Social History Narrative   Not on file   Social Determinants of Health   Financial Resource Strain: Not on file  Food Insecurity: Not on file  Transportation Needs: Not on file  Physical Activity: Not on file  Stress: Not on file  Social Connections: Not on file  Intimate Partner Violence: Not on file     ROS- All systems are reviewed and negative except as per the HPI above.  Physical Exam: There were no vitals filed for this visit.   GEN- The patient is well appearing, alert and oriented x 3 today.   Head- normocephalic, atraumatic Eyes-  Sclera clear, conjunctiva pink Ears- hearing intact Oropharynx- clear Neck- supple  Lungs- Clear to ausculation bilaterally, normal work of breathing Heart- Regular rate and rhythm, no murmurs, rubs or gallops  GI- soft, NT, ND, + BS Extremities- no clubbing, cyanosis, or edema MS- no significant deformity or atrophy Skin- no rash or lesion Psych- euthymic mood, full affect Neuro- strength and sensation are intact  Wt Readings from Last 3 Encounters:  12/31/21 96.6 kg  12/10/21 96 kg  12/07/21 96.2 kg    EKG today demonstrates SR HR 84, T wave inv lateral, PR 134, QRS 98, QTc 430  Echo 03/26/20 demonstrated  1. Left ventricular ejection fraction, by estimation, is 30 to 35%. The  left ventricle has moderately decreased function. The left ventricle  demonstrates  global hypokinesis. Left ventricular diastolic parameters are  indeterminate.   2. Right ventricular systolic function is normal. The right ventricular  size is normal. There is normal pulmonary artery systolic pressure.   3. Left atrial size was severely dilated.   4. Right atrial size was mildly dilated.   5. The mitral valve is normal in structure. Mild mitral valve  regurgitation. No evidence of mitral stenosis.   6. The aortic valve is tricuspid. There is mild calcification of the  aortic valve. There is mild thickening of the aortic valve. Aortic valve  regurgitation is trivial. No aortic stenosis is present.   7. The inferior vena cava is normal in size with greater than 50%  respiratory variability, suggesting right atrial pressure of 3 mmHg.   Epic records are reviewed at length today  CHA2DS2-VASc Score = 4  The patient's score is based upon: CHF History: 1 HTN History: 1 Diabetes History: 0 Stroke History: 0 Vascular Disease History: 1 Age Score: 1 Gender Score: 0      ASSESSMENT AND PLAN: 1. Persistent Atrial Fibrillation (ICD10:  I48.19) The patient's CHA2DS2-VASc score is 4, indicating a 4.8% annual risk of stroke.   S/p afib ablation with Dr Curt Bears 06/18/20 Afib has been quiet since then until s/p surgery He does not feel unstable with this Has a decent blood pressure States he ran fast with his afib prior to ablation in 2021 I will get him to take 25 mg of metoprolol on return home today and then increase to 75 mg bid with BP/HR check prior to each dose. I will see him back tomorrow for v rates  Continue Eliquis 5 mg BID, he had missed doses to allow for surgery prior to 10/24  He resumed last Wednesday    2. Secondary Hypercoagulable State (ICD10:  D68.69) The patient is at significant risk for stroke/thromboembolism based upon his CHA2DS2-VASc Score of 4.  Continue Apixaban (Eliquis).   3. HTN Stable, no changes today.  4. Chronic systolic  CHF Suspected tachycardia mediated prior to ablation 2021 Resolved with restoration of SR by echo 09/2020   Follow up here tomorrow or to ED  if becomes unstable, those S/S reviewed with pt    Butch Penny C. Deseri Loss, [facility] [address] [place], [place] [address] [phone]

## 2022-05-18 NOTE — Telephone Encounter (Signed)
Pt informed Dr. Curt Bears recommends f/u in the AFib clinic and that their office would call to arrange. Pt agreeable to plan.

## 2022-05-18 NOTE — Patient Instructions (Signed)
When you get home take a 1/2 of metoprolol At bedtime increase metoprolol to 1 and 1/2 tablets twice a day

## 2022-05-19 ENCOUNTER — Ambulatory Visit (HOSPITAL_COMMUNITY)
Admission: RE | Admit: 2022-05-19 | Discharge: 2022-05-19 | Disposition: A | Discharge status: Home / Self Care | Payer: Medicare Other | Source: Ambulatory Visit | Attending: Nurse Practitioner | Admitting: Nurse Practitioner

## 2022-05-19 ENCOUNTER — Encounter (HOSPITAL_COMMUNITY): Payer: Self-pay | Admitting: Nurse Practitioner

## 2022-05-19 VITALS — BP 142/76 | HR 144 | Ht 73.0 in

## 2022-05-19 DIAGNOSIS — D6869 Other thrombophilia: Secondary | ICD-10-CM | POA: Diagnosis not present

## 2022-05-19 DIAGNOSIS — I5022 Chronic systolic (congestive) heart failure: Secondary | ICD-10-CM | POA: Diagnosis not present

## 2022-05-19 DIAGNOSIS — I110 Hypertensive heart disease with heart failure: Secondary | ICD-10-CM | POA: Diagnosis not present

## 2022-05-19 DIAGNOSIS — Z7901 Long term (current) use of anticoagulants: Secondary | ICD-10-CM | POA: Diagnosis not present

## 2022-05-19 DIAGNOSIS — I4819 Other persistent atrial fibrillation: Secondary | ICD-10-CM | POA: Insufficient documentation

## 2022-05-19 MED ORDER — METOPROLOL SUCCINATE ER 50 MG PO TB24: 100.0000 mg | ORAL_TABLET | Freq: Two times a day (BID) | ORAL | 3 refills | Status: DC

## 2022-05-19 NOTE — Progress Notes (Signed)
Primary Care Physician: Celene Squibb, MD Primary Cardiologist: Dr Harrington Challenger Primary Electrophysiologist: Dr Curt Bears Referring Physician: Dr Claria Dice is a 71 y.o. male with a history of HTN, HLD, chronic systolic CHF, and atrial fibrillation who presents for follow up in the [facility].  The patient was initially diagnosed with atrial fibrillation 03/2020. Echo 03/26/20 showed EF 30-35%. He underwent DCCV on 04/10/20 but unfortunately was back in afib on follow up 10/14. Patient is on Eliquis for a CHADS2VASC score of 3. He underwent afib ablation with Dr Curt Bears on 06/18/20. He reports that he has has some episodes of afib post ablation, mostly within the first 1-2 weeks post ablation. He has been mostly in SR over the last week. He denies any CP, swallowing, or groin issues.   Pt is now being seen in the afib clinic, 05/18/22 for return of afib after knee replacement 05/11/22. He has felt out of rhythm for 24 hours.. His ekg shows afib at 161 v rate. He is not symptomatic with this other than a jumpy feeling in his chest. He denies chest pain, shortness of breath, pre syncope. He is afebrile at 98.8. No fever or chills at home. He has been out of rhythm x 24 hours. He missed anticoagulation prior to knee surgery. No unusual warmth or swelling  or redness of rt knee.  Return to afib clinic, 05/19/22. He is still in rapid afib but now with a v rate in the 140's, he is tolerating well. I discussed with Dr. Curt Bears and will set up for cardioversion. Since he was off his anticoagulation x 3 days prior to his surgery 10/24 in SR  but back on anticoagulation for 6 days before afib started on Monday, he will not need a TEE but DCCV alone should be sufficient.   Today, he denies symptoms of chest pain, shortness of breath, orthopnea, PND, lower extremity edema, dizziness, presyncope, syncope, snoring, daytime somnolence, bleeding, or neurologic sequela. The patient is  tolerating medications without difficulties and is otherwise without complaint today.    Atrial Fibrillation Risk Factors:  he does not have symptoms or diagnosis of sleep apnea. he does not have a history of rheumatic fever.   he has a BMI of Body mass index is 28.18 kg/m.Marland Kitchen There were no vitals filed for this visit.   Family History  Problem Relation Age of Onset   COPD Father      Atrial Fibrillation Management history:  Previous antiarrhythmic drugs: none Previous cardioversions: 04/10/20 Previous ablations: 06/18/20 CHADS2VASC score: 3 Anticoagulation history: Eliquis   Past Medical History:  Diagnosis Date   Cancer ([place])    Dysrhythmia    AFib   Hypercholesteremia    Persistent atrial fibrillation ([place]) 07/16/2020   Secondary hypercoagulable state ([place]) 07/16/2020   Past Surgical History:  Procedure Laterality Date   ATRIAL FIBRILLATION ABLATION N/A 06/18/2020   Procedure: ATRIAL FIBRILLATION ABLATION;  Surgeon: Constance Haw, MD;  Location: [place] [facility];  Service: Cardiovascular;  Laterality: N/A;   CARDIOVERSION N/A 04/10/2020   Procedure: CARDIOVERSION;  Surgeon: Arnoldo Lenis, MD;  [facility];  Service: Endoscopy;  Laterality: N/A;   COLONOSCOPY WITH PROPOFOL N/A 12/10/2021   Procedure: COLONOSCOPY WITH PROPOFOL;  Surgeon: Daneil Dolin, MD;  [facility];  Service: Endoscopy;  Laterality: N/A;  2:15pm   HERNIA REPAIR Left    JOINT REPLACEMENT     TOTAL KNEE ARTHROPLASTY Left  Current Outpatient Medications  Medication Sig Dispense Refill   Acetaminophen 500 MG capsule Take 1 capsule by mouth every 8 (eight) hours.     ELIQUIS 5 MG TABS tablet Take 5 mg by mouth 2 (two) times daily.     furosemide (LASIX) 40 MG tablet Take 40 mg by mouth daily.     gabapentin (NEURONTIN) 300 MG capsule Take 300 mg by mouth at bedtime.     OXYCODONE HCL PO Taking 1 tablet every 4-6 hours for pain- Not sure the dosage.      potassium chloride (KLOR-CON) 10 MEQ tablet Take 10 mEq by mouth daily.     rosuvastatin (CRESTOR) 10 MG tablet Take 10 mg by mouth at bedtime.      metoprolol succinate (TOPROL-XL) 50 MG 24 hr tablet Take 2 tablets (100 mg total) by mouth 2 (two) times daily. With or immediately following a meal 180 tablet 3   No current facility-administered medications for this encounter.    Allergies  Allergen Reactions   Penicillins Other (See Comments)    Unknown childhood reaction.    Social History   Socioeconomic History   Marital status: Married    Spouse name: Not on file   Number of children: Not on file   Years of education: Not on file   Highest education level: Not on file  Occupational History   Not on file  Tobacco Use   Smoking status: Never   Smokeless tobacco: Never  Vaping Use   Vaping Use: Never used  Substance and Sexual Activity   Alcohol use: Yes    Alcohol/week: 7.0 standard drinks of alcohol    Types: 7 Glasses of wine per week    Comment: socially   Drug use: Never   Sexual activity: Yes  Other Topics Concern   Not on file  Social History Narrative   Not on file   Social Determinants of Health   Financial Resource Strain: Not on file  Food Insecurity: Not on file  Transportation Needs: Not on file  Physical Activity: Not on file  Stress: Not on file  Social Connections: Not on file  Intimate Partner Violence: Not on file     ROS- All systems are reviewed and negative except as per the HPI above.  Physical Exam: Vitals:   05/19/22 1416  BP: (!) 142/76  Pulse: (!) 144  Height: '6\' 1"'$  (1.854 m)     GEN- The patient is well appearing, alert and oriented x 3 today.   Head- normocephalic, atraumatic Eyes-  Sclera clear, conjunctiva pink Ears- hearing intact Oropharynx- clear Neck- supple  Lungs- Clear to ausculation bilaterally, normal work of breathing Heart- Regular rate and rhythm, no murmurs, rubs or gallops  GI- soft, NT, ND, +  BS Extremities- no clubbing, cyanosis, or edema MS- no significant deformity or atrophy Skin- no rash or lesion Psych- euthymic mood, full affect Neuro- strength and sensation are intact  Wt Readings from Last 3 Encounters:  05/18/22 96.9 kg  12/31/21 96.6 kg  12/10/21 96 kg    EKG today demonstrates SR HR 84, T wave inv lateral, PR 134, QRS 98, QTc 430  Echo 03/26/20 demonstrated  1. Left ventricular ejection fraction, by estimation, is 30 to 35%. The  left ventricle has moderately decreased function. The left ventricle  demonstrates global hypokinesis. Left ventricular diastolic parameters are  indeterminate.   2. Right ventricular systolic function is normal. The right ventricular  size is normal. There is normal pulmonary artery  systolic pressure.   3. Left atrial size was severely dilated.   4. Right atrial size was mildly dilated.   5. The mitral valve is normal in structure. Mild mitral valve  regurgitation. No evidence of mitral stenosis.   6. The aortic valve is tricuspid. There is mild calcification of the  aortic valve. There is mild thickening of the aortic valve. Aortic valve  regurgitation is trivial. No aortic stenosis is present.   7. The inferior vena cava is normal in size with greater than 50%  respiratory variability, suggesting right atrial pressure of 3 mmHg.   Epic records are reviewed at length today  CHA2DS2-VASc Score = 4  The patient's score is based upon: CHF History: 1 HTN History: 1 Diabetes History: 0 Stroke History: 0 Vascular Disease History: 1 Age Score: 1 Gender Score: 0      ASSESSMENT AND PLAN: 1. Persistent Atrial Fibrillation (ICD10:  I48.19) The patient's CHA2DS2-VASc score is 4, indicating a 4.8% annual risk of stroke.   S/p afib ablation with Dr Curt Bears 06/18/20 Afib has been quiet since then until s/p  knee surgery 10/24   He does not feel unstable with this Recent increase of metoprolol to 75 mg bid has slowed v rates some,  will increase to 100 mg bid and return to clinic on Friday Has a decent blood pressure States he ran fast with his afib prior to ablation in 2021 Have scheduled cardioversion for 05/26/22 Continue Eliquis 5 mg BID, he had missed doses to allow for surgery prior to 10/24 but was in [place] during this time off anticoagulation, restarted 10/25 and was in SR x 6 days when returning to anticoagulation before having afib    2. Secondary Hypercoagulable State (ICD10:  D68.69) The patient is at significant risk for stroke/thromboembolism based upon his CHA2DS2-VASc Score of 4.  Continue Apixaban (Eliquis).   3. HTN Stable, no changes today.  4. Chronic systolic CHF Suspected tachycardia mediated prior to ablation 2021 Resolved with restoration of SR by echo 09/2020   Follow up here on Friday, again reviewed with pt  the S/S for him to present to the ED.    Geroge Baseman [facility] [place], [facility] [address] [place], [place] [address] [phone]

## 2022-05-19 NOTE — Patient Instructions (Addendum)
Increase metoprolol to '100mg'$  twice a day  Cardioversion scheduled for Wednesday, November  8th  - Arrive at the [facility] and go to admitting at [facility] not eat or drink anything after midnight the night prior to your procedure.  - Take all your morning medication (except diabetic medications) with a sip of water prior to arrival.  - You will not be able to drive home after your procedure.  - Do NOT miss any doses of your blood thinner - if you should miss a dose please notify our office immediately.  - If you feel as if you go back into normal rhythm prior to scheduled cardioversion, please notify our office immediately. If your procedure is canceled in the cardioversion suite you will be charged a cancellation fee.

## 2022-05-20 DIAGNOSIS — M25561 Pain in right knee: Secondary | ICD-10-CM | POA: Diagnosis not present

## 2022-05-20 DIAGNOSIS — R269 Unspecified abnormalities of gait and mobility: Secondary | ICD-10-CM | POA: Diagnosis not present

## 2022-05-20 DIAGNOSIS — M25661 Stiffness of right knee, not elsewhere classified: Secondary | ICD-10-CM | POA: Diagnosis not present

## 2022-05-21 ENCOUNTER — Ambulatory Visit (HOSPITAL_COMMUNITY)
Admission: RE | Admit: 2022-05-21 | Discharge: 2022-05-21 | Disposition: A | Discharge status: Home / Self Care | Payer: Medicare Other | Source: Ambulatory Visit | Attending: Nurse Practitioner | Admitting: Nurse Practitioner

## 2022-05-21 VITALS — BP 134/84 | HR 133

## 2022-05-21 DIAGNOSIS — I4819 Other persistent atrial fibrillation: Secondary | ICD-10-CM | POA: Diagnosis not present

## 2022-05-21 LAB — BASIC METABOLIC PANEL
Anion gap: 11 (ref 5–15)
BUN: 14 mg/dL (ref 8–23)
CO2: 25 mmol/L (ref 22–32)
Calcium: 9 mg/dL (ref 8.9–10.3)
Chloride: 99 mmol/L (ref 98–111)
Creatinine, Ser: 0.78 mg/dL (ref 0.61–1.24)
GFR, Estimated: >60 mL/min (ref >60)
Glucose, Bld: 152 mg/dL — ABNORMAL HIGH (ref 70–99)
Potassium: 4.2 mmol/L (ref 3.5–5.1)
Sodium: 135 mmol/L (ref 135–145)

## 2022-05-21 LAB — CBC
HCT: 41.2 % (ref 39.0–52.0)
Hemoglobin: 13.4 g/dL (ref 13.0–17.0)
MCH: 30.6 pg (ref 26.0–34.0)
MCHC: 32.5 g/dL (ref 30.0–36.0)
MCV: 94.1 fL (ref 80.0–100.0)
Platelets: 564 10*3/uL — ABNORMAL HIGH (ref 150–400)
RBC: 4.38 MIL/uL (ref 4.22–5.81)
RDW: 13.7 % (ref 11.5–15.5)
WBC: 13.3 10*3/uL — ABNORMAL HIGH (ref 4.0–10.5)
nRBC: 0 % (ref 0.0–0.2)

## 2022-05-21 NOTE — Patient Instructions (Signed)
Day of cardioversion - decrease metoprolol to '50mg'$  twice a day

## 2022-05-21 NOTE — Progress Notes (Addendum)
Pt in for EKG with recent persistent afib for the last 5 days, knee surgery week before. Marland Kitchen He was last increased to metoprolol 100 mg bid for rate control. He remains in afib at 130 bpm, initially at 160 bpm. He is tolerating this very well and states that he is not short of breath, lightheaded or overly fatigued. He is scheduled for cardioversion 11/8, cbc/bmet today. He will reduce metoprolol back to 50 mg bid  am of cardioversion. He was reminded to go to ED of he becomes unstable with afib.

## 2022-05-24 ENCOUNTER — Encounter (HOSPITAL_COMMUNITY): Payer: Self-pay

## 2022-05-24 DIAGNOSIS — M25661 Stiffness of right knee, not elsewhere classified: Secondary | ICD-10-CM | POA: Diagnosis not present

## 2022-05-24 DIAGNOSIS — R269 Unspecified abnormalities of gait and mobility: Secondary | ICD-10-CM | POA: Diagnosis not present

## 2022-05-24 DIAGNOSIS — M25561 Pain in right knee: Secondary | ICD-10-CM | POA: Diagnosis not present

## 2022-05-25 ENCOUNTER — Encounter (HOSPITAL_COMMUNITY): Payer: Self-pay

## 2022-05-25 ENCOUNTER — Ambulatory Visit (HOSPITAL_COMMUNITY)
Admission: RE | Admit: 2022-05-25 | Discharge: 2022-05-25 | Disposition: A | Discharge status: Home / Self Care | Payer: Medicare Other | Source: Ambulatory Visit | Attending: Physician Assistant | Admitting: Physician Assistant

## 2022-05-25 DIAGNOSIS — I4819 Other persistent atrial fibrillation: Secondary | ICD-10-CM | POA: Insufficient documentation

## 2022-05-25 NOTE — Progress Notes (Signed)
Patient returns for ECG today. His Karida moblie showed NSR, brought in today for 12 lead to confirm. ECG shows SR HR 95, PR 116, QRS 90, QTc 452. Will cancel DCCV. Follow up in AF clinic in 4-6 weeks.

## 2022-05-26 ENCOUNTER — Encounter (HOSPITAL_COMMUNITY): Admission: RE | Payer: Self-pay | Source: Home / Self Care

## 2022-05-26 ENCOUNTER — Ambulatory Visit (HOSPITAL_COMMUNITY): Admission: RE | Admit: 2022-05-26 | Payer: Medicare Other | Source: Home / Self Care | Admitting: Cardiology

## 2022-05-26 SURGERY — CARDIOVERSION
Anesthesia: Monitor Anesthesia Care

## 2022-05-28 DIAGNOSIS — M25561 Pain in right knee: Secondary | ICD-10-CM | POA: Diagnosis not present

## 2022-05-28 DIAGNOSIS — R269 Unspecified abnormalities of gait and mobility: Secondary | ICD-10-CM | POA: Diagnosis not present

## 2022-05-28 DIAGNOSIS — M25661 Stiffness of right knee, not elsewhere classified: Secondary | ICD-10-CM | POA: Diagnosis not present

## 2022-05-31 DIAGNOSIS — M25561 Pain in right knee: Secondary | ICD-10-CM | POA: Diagnosis not present

## 2022-05-31 DIAGNOSIS — R269 Unspecified abnormalities of gait and mobility: Secondary | ICD-10-CM | POA: Diagnosis not present

## 2022-05-31 DIAGNOSIS — M25661 Stiffness of right knee, not elsewhere classified: Secondary | ICD-10-CM | POA: Diagnosis not present

## 2022-06-02 DIAGNOSIS — M25661 Stiffness of right knee, not elsewhere classified: Secondary | ICD-10-CM | POA: Diagnosis not present

## 2022-06-02 DIAGNOSIS — R269 Unspecified abnormalities of gait and mobility: Secondary | ICD-10-CM | POA: Diagnosis not present

## 2022-06-02 DIAGNOSIS — M25561 Pain in right knee: Secondary | ICD-10-CM | POA: Diagnosis not present

## 2022-06-04 DIAGNOSIS — R269 Unspecified abnormalities of gait and mobility: Secondary | ICD-10-CM | POA: Diagnosis not present

## 2022-06-04 DIAGNOSIS — M25561 Pain in right knee: Secondary | ICD-10-CM | POA: Diagnosis not present

## 2022-06-04 DIAGNOSIS — M25661 Stiffness of right knee, not elsewhere classified: Secondary | ICD-10-CM | POA: Diagnosis not present

## 2022-06-07 DIAGNOSIS — M25561 Pain in right knee: Secondary | ICD-10-CM | POA: Diagnosis not present

## 2022-06-07 DIAGNOSIS — M25661 Stiffness of right knee, not elsewhere classified: Secondary | ICD-10-CM | POA: Diagnosis not present

## 2022-06-07 DIAGNOSIS — R269 Unspecified abnormalities of gait and mobility: Secondary | ICD-10-CM | POA: Diagnosis not present

## 2022-06-08 ENCOUNTER — Ambulatory Visit (HOSPITAL_COMMUNITY): Payer: Medicare Other | Admitting: Nurse Practitioner

## 2022-06-09 DIAGNOSIS — M25661 Stiffness of right knee, not elsewhere classified: Secondary | ICD-10-CM | POA: Diagnosis not present

## 2022-06-09 DIAGNOSIS — M25561 Pain in right knee: Secondary | ICD-10-CM | POA: Diagnosis not present

## 2022-06-09 DIAGNOSIS — R269 Unspecified abnormalities of gait and mobility: Secondary | ICD-10-CM | POA: Diagnosis not present

## 2022-06-15 DIAGNOSIS — M25661 Stiffness of right knee, not elsewhere classified: Secondary | ICD-10-CM | POA: Diagnosis not present

## 2022-06-15 DIAGNOSIS — R269 Unspecified abnormalities of gait and mobility: Secondary | ICD-10-CM | POA: Diagnosis not present

## 2022-06-15 DIAGNOSIS — M25561 Pain in right knee: Secondary | ICD-10-CM | POA: Diagnosis not present

## 2022-06-16 DIAGNOSIS — D485 Neoplasm of uncertain behavior of skin: Secondary | ICD-10-CM | POA: Diagnosis not present

## 2022-06-16 DIAGNOSIS — Z08 Encounter for follow-up examination after completed treatment for malignant neoplasm: Secondary | ICD-10-CM | POA: Diagnosis not present

## 2022-06-16 DIAGNOSIS — Z85820 Personal history of malignant melanoma of skin: Secondary | ICD-10-CM | POA: Diagnosis not present

## 2022-06-16 DIAGNOSIS — Z1283 Encounter for screening for malignant neoplasm of skin: Secondary | ICD-10-CM | POA: Diagnosis not present

## 2022-06-16 DIAGNOSIS — D2271 Melanocytic nevi of right lower limb, including hip: Secondary | ICD-10-CM | POA: Diagnosis not present

## 2022-06-16 DIAGNOSIS — D225 Melanocytic nevi of trunk: Secondary | ICD-10-CM | POA: Diagnosis not present

## 2022-06-16 DIAGNOSIS — L818 Other specified disorders of pigmentation: Secondary | ICD-10-CM | POA: Diagnosis not present

## 2022-06-17 DIAGNOSIS — M25561 Pain in right knee: Secondary | ICD-10-CM | POA: Diagnosis not present

## 2022-06-17 DIAGNOSIS — M25661 Stiffness of right knee, not elsewhere classified: Secondary | ICD-10-CM | POA: Diagnosis not present

## 2022-06-17 DIAGNOSIS — R269 Unspecified abnormalities of gait and mobility: Secondary | ICD-10-CM | POA: Diagnosis not present

## 2022-06-21 DIAGNOSIS — M25661 Stiffness of right knee, not elsewhere classified: Secondary | ICD-10-CM | POA: Diagnosis not present

## 2022-06-21 DIAGNOSIS — M25561 Pain in right knee: Secondary | ICD-10-CM | POA: Diagnosis not present

## 2022-06-21 DIAGNOSIS — R269 Unspecified abnormalities of gait and mobility: Secondary | ICD-10-CM | POA: Diagnosis not present

## 2022-06-24 DIAGNOSIS — R269 Unspecified abnormalities of gait and mobility: Secondary | ICD-10-CM | POA: Diagnosis not present

## 2022-06-24 DIAGNOSIS — M25661 Stiffness of right knee, not elsewhere classified: Secondary | ICD-10-CM | POA: Diagnosis not present

## 2022-06-24 DIAGNOSIS — M25561 Pain in right knee: Secondary | ICD-10-CM | POA: Diagnosis not present

## 2022-06-25 DIAGNOSIS — I5022 Chronic systolic (congestive) heart failure: Secondary | ICD-10-CM | POA: Diagnosis not present

## 2022-06-25 DIAGNOSIS — Z0000 Encounter for general adult medical examination without abnormal findings: Secondary | ICD-10-CM | POA: Diagnosis not present

## 2022-06-25 DIAGNOSIS — I480 Paroxysmal atrial fibrillation: Secondary | ICD-10-CM | POA: Diagnosis not present

## 2022-06-25 DIAGNOSIS — Z85828 Personal history of other malignant neoplasm of skin: Secondary | ICD-10-CM | POA: Diagnosis not present

## 2022-06-25 DIAGNOSIS — Z96652 Presence of left artificial knee joint: Secondary | ICD-10-CM | POA: Diagnosis not present

## 2022-06-25 DIAGNOSIS — E663 Overweight: Secondary | ICD-10-CM | POA: Diagnosis not present

## 2022-06-25 DIAGNOSIS — Z96651 Presence of right artificial knee joint: Secondary | ICD-10-CM | POA: Diagnosis not present

## 2022-06-25 DIAGNOSIS — I10 Essential (primary) hypertension: Secondary | ICD-10-CM | POA: Diagnosis not present

## 2022-06-25 DIAGNOSIS — J069 Acute upper respiratory infection, unspecified: Secondary | ICD-10-CM | POA: Diagnosis not present

## 2022-06-28 DIAGNOSIS — M25561 Pain in right knee: Secondary | ICD-10-CM | POA: Diagnosis not present

## 2022-06-28 DIAGNOSIS — M25661 Stiffness of right knee, not elsewhere classified: Secondary | ICD-10-CM | POA: Diagnosis not present

## 2022-06-28 DIAGNOSIS — R269 Unspecified abnormalities of gait and mobility: Secondary | ICD-10-CM | POA: Diagnosis not present

## 2022-06-29 ENCOUNTER — Other Ambulatory Visit (HOSPITAL_COMMUNITY): Payer: Self-pay | Admitting: Internal Medicine

## 2022-06-29 DIAGNOSIS — J189 Pneumonia, unspecified organism: Secondary | ICD-10-CM

## 2022-06-30 ENCOUNTER — Ambulatory Visit (HOSPITAL_COMMUNITY)
Admission: RE | Admit: 2022-06-30 | Discharge: 2022-06-30 | Disposition: A | Discharge status: Home / Self Care | Payer: Medicare Other | Source: Ambulatory Visit | Attending: Internal Medicine | Admitting: Internal Medicine

## 2022-06-30 DIAGNOSIS — J189 Pneumonia, unspecified organism: Secondary | ICD-10-CM | POA: Diagnosis not present

## 2022-06-30 DIAGNOSIS — R059 Cough, unspecified: Secondary | ICD-10-CM | POA: Diagnosis not present

## 2022-07-02 DIAGNOSIS — M25561 Pain in right knee: Secondary | ICD-10-CM | POA: Diagnosis not present

## 2022-07-02 DIAGNOSIS — R269 Unspecified abnormalities of gait and mobility: Secondary | ICD-10-CM | POA: Diagnosis not present

## 2022-07-02 DIAGNOSIS — M25661 Stiffness of right knee, not elsewhere classified: Secondary | ICD-10-CM | POA: Diagnosis not present

## 2022-07-05 DIAGNOSIS — Z96651 Presence of right artificial knee joint: Secondary | ICD-10-CM | POA: Diagnosis not present

## 2022-07-06 DIAGNOSIS — D485 Neoplasm of uncertain behavior of skin: Secondary | ICD-10-CM | POA: Diagnosis not present

## 2022-07-06 DIAGNOSIS — L570 Actinic keratosis: Secondary | ICD-10-CM | POA: Diagnosis not present

## 2022-07-06 DIAGNOSIS — X32XXXD Exposure to sunlight, subsequent encounter: Secondary | ICD-10-CM | POA: Diagnosis not present

## 2022-07-07 ENCOUNTER — Ambulatory Visit (HOSPITAL_COMMUNITY): Payer: Medicare Other | Admitting: Nurse Practitioner

## 2022-07-07 DIAGNOSIS — M25561 Pain in right knee: Secondary | ICD-10-CM | POA: Diagnosis not present

## 2022-07-07 DIAGNOSIS — R269 Unspecified abnormalities of gait and mobility: Secondary | ICD-10-CM | POA: Diagnosis not present

## 2022-07-07 DIAGNOSIS — M25661 Stiffness of right knee, not elsewhere classified: Secondary | ICD-10-CM | POA: Diagnosis not present

## 2022-07-08 DIAGNOSIS — M25561 Pain in right knee: Secondary | ICD-10-CM | POA: Diagnosis not present

## 2022-07-08 DIAGNOSIS — R269 Unspecified abnormalities of gait and mobility: Secondary | ICD-10-CM | POA: Diagnosis not present

## 2022-07-08 DIAGNOSIS — M25661 Stiffness of right knee, not elsewhere classified: Secondary | ICD-10-CM | POA: Diagnosis not present

## 2022-07-14 DIAGNOSIS — R269 Unspecified abnormalities of gait and mobility: Secondary | ICD-10-CM | POA: Diagnosis not present

## 2022-07-14 DIAGNOSIS — M25561 Pain in right knee: Secondary | ICD-10-CM | POA: Diagnosis not present

## 2022-07-14 DIAGNOSIS — M25661 Stiffness of right knee, not elsewhere classified: Secondary | ICD-10-CM | POA: Diagnosis not present

## 2022-07-16 DIAGNOSIS — R269 Unspecified abnormalities of gait and mobility: Secondary | ICD-10-CM | POA: Diagnosis not present

## 2022-07-16 DIAGNOSIS — M25561 Pain in right knee: Secondary | ICD-10-CM | POA: Diagnosis not present

## 2022-07-16 DIAGNOSIS — M25661 Stiffness of right knee, not elsewhere classified: Secondary | ICD-10-CM | POA: Diagnosis not present

## 2022-07-20 ENCOUNTER — Ambulatory Visit (HOSPITAL_COMMUNITY)
Admission: RE | Admit: 2022-07-20 | Discharge: 2022-07-20 | Disposition: A | Discharge status: Home / Self Care | Payer: Medicare Other | Source: Ambulatory Visit | Attending: Nurse Practitioner | Admitting: Nurse Practitioner

## 2022-07-20 ENCOUNTER — Encounter (HOSPITAL_COMMUNITY): Payer: Self-pay | Admitting: Nurse Practitioner

## 2022-07-20 VITALS — BP 148/92 | HR 77 | Ht 73.0 in | Wt 210.6 lb

## 2022-07-20 DIAGNOSIS — I110 Hypertensive heart disease with heart failure: Secondary | ICD-10-CM | POA: Diagnosis not present

## 2022-07-20 DIAGNOSIS — I5022 Chronic systolic (congestive) heart failure: Secondary | ICD-10-CM | POA: Insufficient documentation

## 2022-07-20 DIAGNOSIS — M25661 Stiffness of right knee, not elsewhere classified: Secondary | ICD-10-CM | POA: Diagnosis not present

## 2022-07-20 DIAGNOSIS — E785 Hyperlipidemia, unspecified: Secondary | ICD-10-CM | POA: Diagnosis not present

## 2022-07-20 DIAGNOSIS — I4819 Other persistent atrial fibrillation: Secondary | ICD-10-CM | POA: Insufficient documentation

## 2022-07-20 DIAGNOSIS — Z7901 Long term (current) use of anticoagulants: Secondary | ICD-10-CM | POA: Insufficient documentation

## 2022-07-20 DIAGNOSIS — Z79899 Other long term (current) drug therapy: Secondary | ICD-10-CM | POA: Diagnosis not present

## 2022-07-20 DIAGNOSIS — R269 Unspecified abnormalities of gait and mobility: Secondary | ICD-10-CM | POA: Diagnosis not present

## 2022-07-20 DIAGNOSIS — I4891 Unspecified atrial fibrillation: Secondary | ICD-10-CM

## 2022-07-20 DIAGNOSIS — D6869 Other thrombophilia: Secondary | ICD-10-CM | POA: Diagnosis not present

## 2022-07-20 DIAGNOSIS — M25561 Pain in right knee: Secondary | ICD-10-CM | POA: Diagnosis not present

## 2022-07-20 MED ORDER — METOPROLOL SUCCINATE ER 50 MG PO TB24: 50.0000 mg | ORAL_TABLET | Freq: Two times a day (BID) | ORAL | Status: DC

## 2022-07-20 NOTE — Progress Notes (Signed)
Primary Care Physician: Celene Squibb, MD Primary Cardiologist: Dr Harrington Challenger Primary Electrophysiologist: Dr Curt Bears Referring Physician: Dr Claria Dice is a 72 y.o. male with a history of HTN, HLD, chronic systolic CHF, and atrial fibrillation who presents for follow up in the [facility].  The patient was initially diagnosed with atrial fibrillation 03/2020. Echo 03/26/20 showed EF 30-35%. He underwent DCCV on 04/10/20 but unfortunately was back in afib on follow up 10/14. Patient is on Eliquis for a CHADS2VASC score of 3. He underwent afib ablation with Dr Curt Bears on 06/18/20. He reports that he has has some episodes of afib post ablation, mostly within the first 1-2 weeks post ablation. He has been mostly in SR over the last week. He denies any CP, swallowing, or groin issues.   Pt is now being seen in the afib clinic, 05/18/22 for return of afib after knee replacement 05/11/22. He has felt out of rhythm for 24 hours.. His ekg shows afib at 161 v rate. He is not symptomatic with this other than a jumpy feeling in his chest. He denies chest pain, shortness of breath, pre syncope. He is afebrile at 98.8. No fever or chills at home. He has been out of rhythm x 24 hours. He missed anticoagulation prior to knee surgery. No unusual warmth or swelling  or redness of rt knee.  Return to afib clinic, 05/19/22. He is still in rapid afib but now with a v rate in the 140's, he is tolerating well. I discussed with Dr. Curt Bears and will set up for cardioversion. Since he was off his anticoagulation x 3 days prior to his surgery 10/24 in SR  but back on anticoagulation for 6 days before afib started on Monday, he will not need a TEE but DCCV alone should be sufficient.   F/u in afib clinic, 07/20/22. He continues in  SR. Several days after last visit when cardioversion was set up, he converted to SR and has been doing well since then in normal rhythm. He has been having some issues  with his rt knee still and goes back to orthopedist this week. He may have to have a minor procedure. He is now back on metoprolol 50 mg bid, his usual dose. Complaint with eliquis 5 mg bid.  Today, he denies symptoms of chest pain, shortness of breath, orthopnea, PND, lower extremity edema, dizziness, presyncope, syncope, snoring, daytime somnolence, bleeding, or neurologic sequela. The patient is tolerating medications without difficulties and is otherwise without complaint today.    Atrial Fibrillation Risk Factors:  he does not have symptoms or diagnosis of sleep apnea. he does not have a history of rheumatic fever.   he has a BMI of Body mass index is 27.79 kg/m.Marland Kitchen Filed Weights   07/20/22 0942  Weight: 95.5 kg     Family History  Problem Relation Age of Onset   COPD Father      Atrial Fibrillation Management history:  Previous antiarrhythmic drugs: none Previous cardioversions: 04/10/20 Previous ablations: 06/18/20 CHADS2VASC score: 3 Anticoagulation history: Eliquis   Past Medical History:  Diagnosis Date   Cancer ([facility])    Dysrhythmia    AFib   Hypercholesteremia    Persistent atrial fibrillation ([place]) 07/16/2020   Secondary hypercoagulable state ([place]) 07/16/2020   Past Surgical History:  Procedure Laterality Date   ATRIAL FIBRILLATION ABLATION N/A 06/18/2020   Procedure: ATRIAL FIBRILLATION ABLATION;  Surgeon: Constance Haw, MD;  Location: [facility];  Service: Cardiovascular;  Laterality: N/A;   CARDIOVERSION N/A 04/10/2020   Procedure: CARDIOVERSION;  Surgeon: Arnoldo Lenis, MD;  Location: [facility];  Service: Endoscopy;  Laterality: N/A;   COLONOSCOPY WITH PROPOFOL N/A 12/10/2021   Procedure: COLONOSCOPY WITH PROPOFOL;  Surgeon: Daneil Dolin, MD;  Location: [facility];  Service: Endoscopy;  Laterality: N/A;  2:15pm   HERNIA REPAIR Left    JOINT REPLACEMENT     TOTAL KNEE ARTHROPLASTY Left     Current Outpatient Medications   Medication Sig Dispense Refill   Acetaminophen 500 MG capsule Take 1,000 mg by mouth every 8 (eight) hours.     clindamycin (CLEOCIN) 300 MG capsule Take 300 mg by mouth as needed. For dental procedures     ELIQUIS 5 MG TABS tablet Take 5 mg by mouth 2 (two) times daily.     furosemide (LASIX) 40 MG tablet Take 40 mg by mouth daily.     OxyCODONE HCl, Abuse Deter, (OXAYDO) 5 MG TABA Take 5 mg by mouth every 6 (six) hours as needed (pain).     potassium chloride (KLOR-CON) 10 MEQ tablet Take 10 mEq by mouth daily.     rosuvastatin (CRESTOR) 10 MG tablet Take 10 mg by mouth at bedtime.      metoprolol succinate (TOPROL-XL) 50 MG 24 hr tablet Take 1 tablet (50 mg total) by mouth 2 (two) times daily. With or immediately following a meal     No current facility-administered medications for this encounter.    Allergies  Allergen Reactions   Penicillins Other (See Comments)    Unknown childhood reaction.    Social History   Socioeconomic History   Marital status: Married    Spouse name: Not on file   Number of children: Not on file   Years of education: Not on file   Highest education level: Not on file  Occupational History   Not on file  Tobacco Use   Smoking status: Never   Smokeless tobacco: Never  Vaping Use   Vaping Use: Never used  Substance and Sexual Activity   Alcohol use: Yes    Alcohol/week: 7.0 standard drinks of alcohol    Types: 7 Glasses of wine per week    Comment: socially   Drug use: Never   Sexual activity: Yes  Other Topics Concern   Not on file  Social History Narrative   Not on file   Social Determinants of Health   Financial Resource Strain: Not on file  Food Insecurity: Not on file  Transportation Needs: Not on file  Physical Activity: Not on file  Stress: Not on file  Social Connections: Not on file  Intimate Partner Violence: Not on file     ROS- All systems are reviewed and negative except as per the HPI above.  Physical  Exam: Vitals:   07/20/22 0942  BP: (!) 148/92  Pulse: 77  Weight: 95.5 kg  Height: '6\' 1"'$  (1.854 m)     GEN- The patient is well appearing, alert and oriented x 3 today.   Head- normocephalic, atraumatic Eyes-  Sclera clear, conjunctiva pink Ears- hearing intact Oropharynx- clear Neck- supple  Lungs- Clear to ausculation bilaterally, normal work of breathing Heart- Regular rate and rhythm, no murmurs, rubs or gallops  GI- soft, NT, ND, + BS Extremities- no clubbing, cyanosis, or edema MS- no significant deformity or atrophy Skin- no rash or lesion Psych- euthymic mood, full affect Neuro- strength and sensation are intact  Wt Readings from Last 3 Encounters:  07/20/22 95.5 kg  05/18/22 96.9 kg  12/31/21 96.6 kg    EKG today demonstrates Vent. rate 77 BPM PR interval 110 ms QRS duration 104 ms QT/QTcB 414/468 ms P-R-T axes 21 5 84 Sinus rhythm with short PR Nonspecific ST abnormality When compared with ECG of 25-May-2022 14:54, No significant change was found Confirmed by Minus Breeding [phone]) on 07/20/2022 3:13:48 PM  Echo 03/26/20 demonstrated  1. Left ventricular ejection fraction, by estimation, is 30 to 35%. The  left ventricle has moderately decreased function. The left ventricle  demonstrates global hypokinesis. Left ventricular diastolic parameters are  indeterminate.   2. Right ventricular systolic function is normal. The right ventricular  size is normal. There is normal pulmonary artery systolic pressure.   3. Left atrial size was severely dilated.   4. Right atrial size was mildly dilated.   5. The mitral valve is normal in structure. Mild mitral valve  regurgitation. No evidence of mitral stenosis.   6. The aortic valve is tricuspid. There is mild calcification of the  aortic valve. There is mild thickening of the aortic valve. Aortic valve  regurgitation is trivial. No aortic stenosis is present.   7. The inferior vena cava is normal in size with  greater than 50%  respiratory variability, suggesting right atrial pressure of 3 mmHg.   Epic records are reviewed at length today  CHA2DS2-VASc Score = 4  The patient's score is based upon: CHF History: 1 HTN History: 1 Diabetes History: 0 Stroke History: 0 Vascular Disease History: 1 Age Score: 1 Gender Score: 0      ASSESSMENT AND PLAN: 1. Persistent Atrial Fibrillation (ICD10:  I48.19) The patient's CHA2DS2-VASc score is 4, indicating a 4.8% annual risk of stroke.   S/p afib ablation with Dr Curt Bears 06/18/20 Afib has been quiet since then until s/p  knee surgery 10/24   He had several days of rapid afib but self converted with higher doses of metoprolol Continue Eliquis 5 mg BID    2. Secondary Hypercoagulable State (ICD10:  D68.69) The patient is at significant risk for stroke/thromboembolism based upon his CHA2DS2-VASc Score of 4.  Continue Apixaban (Eliquis).   3. HTN Stable, no changes today.  4. Chronic systolic CHF Suspected tachycardia mediated prior to ablation 2021 Resolved with restoration of SR by echo 09/2020    F/u with Dr. Curt Bears in June 2024   Butch Penny C. Quante Pettry, [facility] [address] [place],  [address] [phone]

## 2022-07-22 DIAGNOSIS — R269 Unspecified abnormalities of gait and mobility: Secondary | ICD-10-CM | POA: Diagnosis not present

## 2022-07-22 DIAGNOSIS — M25661 Stiffness of right knee, not elsewhere classified: Secondary | ICD-10-CM | POA: Diagnosis not present

## 2022-07-22 DIAGNOSIS — M25561 Pain in right knee: Secondary | ICD-10-CM | POA: Diagnosis not present

## 2022-07-27 DIAGNOSIS — M25661 Stiffness of right knee, not elsewhere classified: Secondary | ICD-10-CM | POA: Diagnosis not present

## 2022-07-27 DIAGNOSIS — M25561 Pain in right knee: Secondary | ICD-10-CM | POA: Diagnosis not present

## 2022-07-27 DIAGNOSIS — R269 Unspecified abnormalities of gait and mobility: Secondary | ICD-10-CM | POA: Diagnosis not present

## 2022-07-28 DIAGNOSIS — Z1283 Encounter for screening for malignant neoplasm of skin: Secondary | ICD-10-CM | POA: Diagnosis not present

## 2022-07-28 DIAGNOSIS — D485 Neoplasm of uncertain behavior of skin: Secondary | ICD-10-CM | POA: Diagnosis not present

## 2022-07-28 DIAGNOSIS — L905 Scar conditions and fibrosis of skin: Secondary | ICD-10-CM | POA: Diagnosis not present

## 2022-07-30 DIAGNOSIS — M25661 Stiffness of right knee, not elsewhere classified: Secondary | ICD-10-CM | POA: Diagnosis not present

## 2022-07-30 DIAGNOSIS — R269 Unspecified abnormalities of gait and mobility: Secondary | ICD-10-CM | POA: Diagnosis not present

## 2022-07-30 DIAGNOSIS — M25561 Pain in right knee: Secondary | ICD-10-CM | POA: Diagnosis not present

## 2022-08-03 DIAGNOSIS — M25561 Pain in right knee: Secondary | ICD-10-CM | POA: Diagnosis not present

## 2022-08-03 DIAGNOSIS — R269 Unspecified abnormalities of gait and mobility: Secondary | ICD-10-CM | POA: Diagnosis not present

## 2022-08-03 DIAGNOSIS — E782 Mixed hyperlipidemia: Secondary | ICD-10-CM | POA: Diagnosis not present

## 2022-08-03 DIAGNOSIS — M25661 Stiffness of right knee, not elsewhere classified: Secondary | ICD-10-CM | POA: Diagnosis not present

## 2022-08-03 DIAGNOSIS — R7301 Impaired fasting glucose: Secondary | ICD-10-CM | POA: Diagnosis not present

## 2022-08-06 DIAGNOSIS — E663 Overweight: Secondary | ICD-10-CM | POA: Diagnosis not present

## 2022-08-06 DIAGNOSIS — I480 Paroxysmal atrial fibrillation: Secondary | ICD-10-CM | POA: Diagnosis not present

## 2022-08-06 DIAGNOSIS — Z6827 Body mass index (BMI) 27.0-27.9, adult: Secondary | ICD-10-CM | POA: Diagnosis not present

## 2022-08-06 DIAGNOSIS — R7301 Impaired fasting glucose: Secondary | ICD-10-CM | POA: Diagnosis not present

## 2022-08-06 DIAGNOSIS — I110 Hypertensive heart disease with heart failure: Secondary | ICD-10-CM | POA: Diagnosis not present

## 2022-08-06 DIAGNOSIS — R269 Unspecified abnormalities of gait and mobility: Secondary | ICD-10-CM | POA: Diagnosis not present

## 2022-08-06 DIAGNOSIS — M25561 Pain in right knee: Secondary | ICD-10-CM | POA: Diagnosis not present

## 2022-08-06 DIAGNOSIS — I10 Essential (primary) hypertension: Secondary | ICD-10-CM | POA: Diagnosis not present

## 2022-08-06 DIAGNOSIS — M25661 Stiffness of right knee, not elsewhere classified: Secondary | ICD-10-CM | POA: Diagnosis not present

## 2022-08-06 DIAGNOSIS — Z96653 Presence of artificial knee joint, bilateral: Secondary | ICD-10-CM | POA: Diagnosis not present

## 2022-08-06 DIAGNOSIS — I5022 Chronic systolic (congestive) heart failure: Secondary | ICD-10-CM | POA: Diagnosis not present

## 2022-08-06 DIAGNOSIS — E782 Mixed hyperlipidemia: Secondary | ICD-10-CM | POA: Diagnosis not present

## 2022-08-06 DIAGNOSIS — Z85828 Personal history of other malignant neoplasm of skin: Secondary | ICD-10-CM | POA: Diagnosis not present

## 2022-08-10 DIAGNOSIS — M25561 Pain in right knee: Secondary | ICD-10-CM | POA: Diagnosis not present

## 2022-08-10 DIAGNOSIS — M25661 Stiffness of right knee, not elsewhere classified: Secondary | ICD-10-CM | POA: Diagnosis not present

## 2022-08-10 DIAGNOSIS — R269 Unspecified abnormalities of gait and mobility: Secondary | ICD-10-CM | POA: Diagnosis not present

## 2022-08-12 DIAGNOSIS — M25661 Stiffness of right knee, not elsewhere classified: Secondary | ICD-10-CM | POA: Diagnosis not present

## 2022-08-12 DIAGNOSIS — R269 Unspecified abnormalities of gait and mobility: Secondary | ICD-10-CM | POA: Diagnosis not present

## 2022-08-12 DIAGNOSIS — M25561 Pain in right knee: Secondary | ICD-10-CM | POA: Diagnosis not present

## 2022-08-17 DIAGNOSIS — M25661 Stiffness of right knee, not elsewhere classified: Secondary | ICD-10-CM | POA: Diagnosis not present

## 2022-08-17 DIAGNOSIS — R269 Unspecified abnormalities of gait and mobility: Secondary | ICD-10-CM | POA: Diagnosis not present

## 2022-08-17 DIAGNOSIS — M25561 Pain in right knee: Secondary | ICD-10-CM | POA: Diagnosis not present

## 2022-08-20 DIAGNOSIS — H40013 Open angle with borderline findings, low risk, bilateral: Secondary | ICD-10-CM | POA: Diagnosis not present

## 2022-08-26 ENCOUNTER — Encounter (HOSPITAL_COMMUNITY): Payer: Self-pay | Admitting: *Deleted

## 2022-09-15 DIAGNOSIS — L570 Actinic keratosis: Secondary | ICD-10-CM | POA: Diagnosis not present

## 2022-09-15 DIAGNOSIS — Z08 Encounter for follow-up examination after completed treatment for malignant neoplasm: Secondary | ICD-10-CM | POA: Diagnosis not present

## 2022-09-15 DIAGNOSIS — C44519 Basal cell carcinoma of skin of other part of trunk: Secondary | ICD-10-CM | POA: Diagnosis not present

## 2022-09-15 DIAGNOSIS — Z1283 Encounter for screening for malignant neoplasm of skin: Secondary | ICD-10-CM | POA: Diagnosis not present

## 2022-09-15 DIAGNOSIS — X32XXXD Exposure to sunlight, subsequent encounter: Secondary | ICD-10-CM | POA: Diagnosis not present

## 2022-09-15 DIAGNOSIS — D225 Melanocytic nevi of trunk: Secondary | ICD-10-CM | POA: Diagnosis not present

## 2022-09-15 DIAGNOSIS — Z85820 Personal history of malignant melanoma of skin: Secondary | ICD-10-CM | POA: Diagnosis not present

## 2022-09-27 DIAGNOSIS — J018 Other acute sinusitis: Secondary | ICD-10-CM | POA: Diagnosis not present

## 2022-09-27 DIAGNOSIS — Z66 Do not resuscitate: Secondary | ICD-10-CM | POA: Diagnosis not present

## 2022-09-27 DIAGNOSIS — J019 Acute sinusitis, unspecified: Secondary | ICD-10-CM | POA: Diagnosis not present

## 2022-10-27 ENCOUNTER — Other Ambulatory Visit: Payer: Self-pay | Admitting: Cardiology

## 2022-10-27 DIAGNOSIS — Z08 Encounter for follow-up examination after completed treatment for malignant neoplasm: Secondary | ICD-10-CM | POA: Diagnosis not present

## 2022-10-27 DIAGNOSIS — Z85820 Personal history of malignant melanoma of skin: Secondary | ICD-10-CM | POA: Diagnosis not present

## 2022-10-27 DIAGNOSIS — Z85828 Personal history of other malignant neoplasm of skin: Secondary | ICD-10-CM | POA: Diagnosis not present

## 2022-10-28 NOTE — Telephone Encounter (Signed)
This is a A-Fib pt that was just seen in January 2024 in the A-Fib clinic. Please address

## 2022-12-30 ENCOUNTER — Ambulatory Visit: Payer: Medicare Other | Admitting: Student

## 2023-01-13 DIAGNOSIS — E782 Mixed hyperlipidemia: Secondary | ICD-10-CM | POA: Diagnosis not present

## 2023-01-13 DIAGNOSIS — R7301 Impaired fasting glucose: Secondary | ICD-10-CM | POA: Diagnosis not present

## 2023-01-14 LAB — LAB REPORT - SCANNED
A1c: 5.9
Albumin, Urine POC: 3.3
Creatinine, POC: 91.4 mg/dL
EGFR: 97
Microalb Creat Ratio: 4

## 2023-01-19 DIAGNOSIS — Z713 Dietary counseling and surveillance: Secondary | ICD-10-CM | POA: Diagnosis not present

## 2023-01-19 DIAGNOSIS — Z96653 Presence of artificial knee joint, bilateral: Secondary | ICD-10-CM | POA: Diagnosis not present

## 2023-01-19 DIAGNOSIS — I480 Paroxysmal atrial fibrillation: Secondary | ICD-10-CM | POA: Diagnosis not present

## 2023-01-19 DIAGNOSIS — Z7182 Exercise counseling: Secondary | ICD-10-CM | POA: Diagnosis not present

## 2023-01-19 DIAGNOSIS — E782 Mixed hyperlipidemia: Secondary | ICD-10-CM | POA: Diagnosis not present

## 2023-01-19 DIAGNOSIS — Z85828 Personal history of other malignant neoplasm of skin: Secondary | ICD-10-CM | POA: Diagnosis not present

## 2023-01-19 DIAGNOSIS — I110 Hypertensive heart disease with heart failure: Secondary | ICD-10-CM | POA: Diagnosis not present

## 2023-01-19 DIAGNOSIS — E663 Overweight: Secondary | ICD-10-CM | POA: Diagnosis not present

## 2023-01-19 DIAGNOSIS — R600 Localized edema: Secondary | ICD-10-CM | POA: Diagnosis not present

## 2023-01-19 DIAGNOSIS — R7301 Impaired fasting glucose: Secondary | ICD-10-CM | POA: Diagnosis not present

## 2023-01-19 DIAGNOSIS — I5022 Chronic systolic (congestive) heart failure: Secondary | ICD-10-CM | POA: Diagnosis not present

## 2023-01-19 DIAGNOSIS — I10 Essential (primary) hypertension: Secondary | ICD-10-CM | POA: Diagnosis not present

## 2023-02-01 NOTE — Progress Notes (Unsigned)
  Electrophysiology Office Note:   Date:  02/02/2023  ID:  Evan Ayala, DOB 07-Nov-1950, MRN [id number]  Primary Cardiologist: Dietrich Pates, MD Electrophysiologist: Regan Lemming, MD      History of Present Illness:   Evan Ayala is a 72 y.o. male with h/o HTN, chronic systolic CHF (suspect tachy-mediated w/ improved EF), elevated coronary calcium score, persistent AF seen today for routine electrophysiology followup.   He was last seen in EP Clinic in 12/2021 and was doing well. Had a knee replacement in 04/2022 and was seen in AF Clinic, planned for DCCV and reported for cardioversion in NSR.   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 Has a full garden.  Asks about losing weight, notes his A1c is 5.9 from PCP (not in system). Denies issues with known AF recently.     Since last being seen in our clinic the patient reports doing very well.  he denies chest pain, palpitations, dyspnea, PND, orthopnea, nausea, vomiting, dizziness, syncope, edema, weight gain, or early satiety.   Review of systems complete and found to be negative unless listed in HPI.   EP Information / Studies Reviewed:    EKG is not ordered today. EKG from 07/20/2022 reviewed which showed NSR      Studies:  EP / Ablation 06/2020 > AF on presentation, successful electrical isolation and anatomical encircling of all four PV's, additional LA ablation performed along posterior wall, converted to SR ECHO 09/2020 > LVEF 55-60%, no RWMA, G1 DD  Risk Assessment/Calculations:    CHA2DS2-VASc Score = 3   This indicates a 3.2% annual risk of stroke. The patient's score is based upon: CHF History: 1 HTN History: 1 Diabetes History: 0 Stroke History: 0 Vascular Disease History: 0 Age Score: 1 Gender Score: 0             Physical Exam:   VS:  BP 120/86   Pulse 70   Ht 6\' 1"  (1.854 m)   Wt 214 lb (97.1 kg)   SpO2 98%   BMI 28.23 kg/m    Wt Readings from Last 3 Encounters:  02/02/23 214 lb  (97.1 kg)  07/20/22 210 lb 9.6 oz (95.5 kg)  05/18/22 213 lb 9.6 oz (96.9 kg)     GEN: Well nourished, well developed in no acute distress NECK: No JVD; No carotid bruits CARDIAC: Regular rate and rhythm, no murmurs, rubs, gallops RESPIRATORY:  Clear to auscultation without rales, wheezing or rhonchi  ABDOMEN: Soft, non-tender, non-distended EXTREMITIES:  No edema; No deformity   ASSESSMENT AND PLAN:    Persistent Atrial Fibrillation  CHA2DS2Vasc 3 / 3.2% annual risk of stroke.  Hx of PVI, LA ablation. -continue toprol 50mg  BID -continue anticoagulation, endorses compliance.  -no known episodes of AF since 04/2022 -sinus on exam   Secondary Hypercoagulable State  -continue eliquis, dose reviewed / appropriate (age, weight > cr 0.78)  NICM Chronic Systolic CHF  Recovered EF -euvolemic on exam   HTN -well controlled   HLD  -crestor per primary    Pt reports pre-diabetes.  Asks about how to lose weight.  Reviewed sugar reduction with rec's for ~50gm carbohydrate per meal, 15mg  for snack. Shopping perimeter of grocery store, etc.    Follow up with EP APP in 6 months  Signed, Canary Brim, MSN, APRN, NP-C, AGACNP-BC [facility] - Electrophysiology  02/02/2023, 11:30 AM

## 2023-02-02 ENCOUNTER — Ambulatory Visit: Payer: Medicare Other | Attending: Student | Admitting: Pulmonary Disease

## 2023-02-02 ENCOUNTER — Encounter: Payer: Self-pay | Admitting: Student

## 2023-02-02 VITALS — BP 120/86 | HR 70 | Ht 73.0 in | Wt 214.0 lb

## 2023-02-02 DIAGNOSIS — I4819 Other persistent atrial fibrillation: Secondary | ICD-10-CM | POA: Diagnosis present

## 2023-02-02 DIAGNOSIS — I428 Other cardiomyopathies: Secondary | ICD-10-CM | POA: Insufficient documentation

## 2023-02-02 DIAGNOSIS — D6869 Other thrombophilia: Secondary | ICD-10-CM

## 2023-02-02 DIAGNOSIS — I10 Essential (primary) hypertension: Secondary | ICD-10-CM

## 2023-02-02 NOTE — Patient Instructions (Signed)
Medication Instructions:  Your physician recommends that you continue on your current medications as directed. Please refer to the Current Medication list given to you today.  *If you need a refill on your cardiac medications before your next appointment, please call your pharmacy*  Lab Work: None ordered If you have labs (blood work) drawn today and your tests are completely normal, you will receive your results only by: MyChart Message (if you have MyChart) OR A paper copy in the mail If you have any lab test that is abnormal or we need to change your treatment, we will call you to review the results.  Follow-Up: At [facility], you and your health needs are our priority.  As part of our continuing mission to provide you with exceptional heart care, we have created designated Provider Care Teams.  These Care Teams include your primary Cardiologist (physician) and Advanced Practice Providers (APPs -  Physician Assistants and Nurse Practitioners) who all work together to provide you with the care you need, when you need it.  Your next appointment:   6 month(s)  Provider:   You will see one of the following Advanced Practice Providers on your designated Care Team:   Francis Dowse, [place] [address] [place], [place] Canary Brim, NP

## 2023-03-15 DIAGNOSIS — Z08 Encounter for follow-up examination after completed treatment for malignant neoplasm: Secondary | ICD-10-CM | POA: Diagnosis not present

## 2023-03-15 DIAGNOSIS — X32XXXD Exposure to sunlight, subsequent encounter: Secondary | ICD-10-CM | POA: Diagnosis not present

## 2023-03-15 DIAGNOSIS — L570 Actinic keratosis: Secondary | ICD-10-CM | POA: Diagnosis not present

## 2023-03-15 DIAGNOSIS — Z1283 Encounter for screening for malignant neoplasm of skin: Secondary | ICD-10-CM | POA: Diagnosis not present

## 2023-03-15 DIAGNOSIS — Z85820 Personal history of malignant melanoma of skin: Secondary | ICD-10-CM | POA: Diagnosis not present

## 2023-03-15 DIAGNOSIS — D225 Melanocytic nevi of trunk: Secondary | ICD-10-CM | POA: Diagnosis not present

## 2023-04-14 DIAGNOSIS — Z23 Encounter for immunization: Secondary | ICD-10-CM | POA: Diagnosis not present

## 2023-05-19 DIAGNOSIS — U071 COVID-19: Secondary | ICD-10-CM | POA: Diagnosis not present

## 2023-06-13 DIAGNOSIS — Z96651 Presence of right artificial knee joint: Secondary | ICD-10-CM | POA: Diagnosis not present

## 2023-06-13 DIAGNOSIS — Z471 Aftercare following joint replacement surgery: Secondary | ICD-10-CM | POA: Diagnosis not present

## 2023-07-18 ENCOUNTER — Other Ambulatory Visit: Payer: Self-pay

## 2023-07-18 MED ORDER — METOPROLOL SUCCINATE ER 50 MG PO TB24: ORAL_TABLET | ORAL | 1 refills | Status: DC

## 2023-07-19 ENCOUNTER — Other Ambulatory Visit: Payer: Self-pay | Admitting: Cardiology

## 2023-08-04 NOTE — Progress Notes (Signed)
Electrophysiology Office Note:   Date:  08/05/2023  ID:  Evan Ayala, DOB 09-17-1950, MRN [id number]  Primary Cardiologist: Dietrich Pates, MD Primary Heart Failure: None Electrophysiologist: Will Jorja Loa, MD      History of Present Illness:   Evan Ayala is a 73 y.o. male with h/o persistent AF, HTN, chronic systolic CHF (suspect tachy-mediated w/ improved EF), elevated coronary calcium score seen today for routine electrophysiology followup.   Since last being seen in our clinic the patient reports doing well. H walks a lot on his [place]. Has two young dogs that he gets out with on the farm.  He reports he had one episode of AF after his knee surgery last year and one in December after drinking alcohol with family that lasted 2-3 hours and resolved spontaneously.  He denies chest pain, palpitations, dyspnea, PND, orthopnea, nausea, vomiting, dizziness, syncope, edema, weight gain, or early satiety.   Review of systems complete and found to be negative unless listed in HPI.   EP Information / Studies Reviewed:    EKG is ordered today. Personal review as below.  EKG Interpretation Date/Time:  Friday August 05 2023 09:34:56 EST Ventricular Rate:  68 PR Interval:  132 QRS Duration:  94 QT Interval:  410 QTC Calculation: 435 R Axis:   21  Text Interpretation: Normal sinus rhythm Confirmed by Canary Brim ([id number]) on 08/05/2023 9:42:51 AM   Studies:  EPS / Ablation 06/2020 > AF on presentation, successful electrical isolation and anatomical encircling of all four PV's, additional LA ablation performed along posterior wall, converted to SR ECHO 09/2020 > LVEF 55-60%, no RWMA, G1 DD    Arrhythmia / AAD AF s/p Ablation 06/2020     Risk Assessment/Calculations:    CHA2DS2-VASc Score = 3   This indicates a 3.2% annual risk of stroke. The patient's score is based upon: CHF History: 1 HTN History: 1 Diabetes History: 0 Stroke History: 0 Vascular Disease History:  0 Age Score: 1 Gender Score: 0             Physical Exam:   VS:  BP 136/88   Pulse 68   Ht 6\' 1"  (1.854 m)   Wt 220 lb 9.6 oz (100.1 kg)   SpO2 96%   BMI 29.10 kg/m    Wt Readings from Last 3 Encounters:  08/05/23 220 lb 9.6 oz (100.1 kg)  02/02/23 214 lb (97.1 kg)  07/20/22 210 lb 9.6 oz (95.5 kg)     GEN: Well nourished, well developed in no acute distress NECK: No JVD; No carotid bruits CARDIAC: Regular rate and rhythm, no murmurs, rubs, gallops RESPIRATORY:  Clear to auscultation without rales, wheezing or rhonchi  ABDOMEN: Soft, non-tender, non-distended EXTREMITIES:  No edema; No deformity   ASSESSMENT AND PLAN:    Persistent Atrial Fibrillation  CHA2DS2-VASc 3. Hx PVI, LA ablation  -toprol 50mg  BID  -continue anticoagulation for stroke prophylaxis  -Toprol 50mg  BID -two isolated episodes of AF as above  -discussed ETOH impact on AF / risk factors -EKG with NSR   -monitors with [employer] and [employer]  Secondary Hypercoagulable State  -continue Eliquis, dose reviewed and appropriate by age/wt  Hypertension  -well controlled on current regimen   HLD -statin per primary > pt hoping to go back to 10mg  of Crestor given joint aches, pending visit with PCP in 2 weeks   Follow up with Dr. Elberta Fortis or B. Veleta Miners, NP in 12 months  Signed, Canary Brim,  NP-C, AGACNP-BC [facility] - Electrophysiology  08/05/2023, 12:12 PM

## 2023-08-05 ENCOUNTER — Encounter: Payer: Self-pay | Admitting: Pulmonary Disease

## 2023-08-05 ENCOUNTER — Ambulatory Visit: Payer: Medicare Other | Attending: Pulmonary Disease | Admitting: Pulmonary Disease

## 2023-08-05 VITALS — BP 136/88 | HR 68 | Ht 73.0 in | Wt 220.6 lb

## 2023-08-05 DIAGNOSIS — I428 Other cardiomyopathies: Secondary | ICD-10-CM | POA: Diagnosis not present

## 2023-08-05 DIAGNOSIS — I4819 Other persistent atrial fibrillation: Secondary | ICD-10-CM | POA: Diagnosis not present

## 2023-08-05 DIAGNOSIS — D6869 Other thrombophilia: Secondary | ICD-10-CM

## 2023-08-05 DIAGNOSIS — I10 Essential (primary) hypertension: Secondary | ICD-10-CM | POA: Diagnosis not present

## 2023-08-05 NOTE — Patient Instructions (Signed)
Medication Instructions:  Your physician recommends that you continue on your current medications as directed. Please refer to the Current Medication list given to you today.  *If you need a refill on your cardiac medications before your next appointment, please call your pharmacy*  Lab Work: CBC, BMET-TODAY If you have labs (blood work) drawn today and your tests are completely normal, you will receive your results only by: MyChart Message (if you have MyChart) OR A paper copy in the mail If you have any lab test that is abnormal or we need to change your treatment, we will call you to review the results.  Follow-Up: At [facility], you and your health needs are our priority.  As part of our continuing mission to provide you with exceptional heart care, we have created designated Provider Care Teams.  These Care Teams include your primary Cardiologist (physician) and Advanced Practice Providers (APPs -  Physician Assistants and Nurse Practitioners) who all work together to provide you with the care you need, when you need it.  1 year(s)  Provider:   Loman Brooklyn, MD or Canary Brim, NP    Schedule overdue follow up with Dr Tenny Craw

## 2023-08-06 LAB — BASIC METABOLIC PANEL
BUN/Creatinine Ratio: 16 (ref 10–24)
BUN: 12 mg/dL (ref 8–27)
CO2: 21 mmol/L (ref 20–29)
Calcium: 9.7 mg/dL (ref 8.6–10.2)
Chloride: 100 mmol/L (ref 96–106)
Creatinine, Ser: 0.74 mg/dL — ABNORMAL LOW (ref 0.76–1.27)
Glucose: 133 mg/dL — ABNORMAL HIGH (ref 70–99)
Potassium: 4.7 mmol/L (ref 3.5–5.2)
Sodium: 141 mmol/L (ref 134–144)
eGFR: 97 mL/min/{1.73_m2} (ref >59)

## 2023-08-06 LAB — CBC
Hematocrit: 49.3 % (ref 37.5–51.0)
Hemoglobin: 16 g/dL (ref 13.0–17.7)
MCH: 30.2 pg (ref 26.6–33.0)
MCHC: 32.5 g/dL (ref 31.5–35.7)
MCV: 93 fL (ref 79–97)
Platelets: 236 10*3/uL (ref 150–450)
RBC: 5.3 x10E6/uL (ref 4.14–5.80)
RDW: 12.9 % (ref 11.6–15.4)
WBC: 7.1 10*3/uL (ref 3.4–10.8)

## 2023-08-12 DIAGNOSIS — E782 Mixed hyperlipidemia: Secondary | ICD-10-CM | POA: Diagnosis not present

## 2023-08-12 DIAGNOSIS — R7301 Impaired fasting glucose: Secondary | ICD-10-CM | POA: Diagnosis not present

## 2023-08-12 DIAGNOSIS — Z125 Encounter for screening for malignant neoplasm of prostate: Secondary | ICD-10-CM | POA: Diagnosis not present

## 2023-08-18 LAB — LAB REPORT - SCANNED
A1c: 6.1
Albumin, Urine POC: <3
Creatinine, POC: 26.1 mg/dL
EGFR: 97
Microalb Creat Ratio: <11

## 2023-08-19 DIAGNOSIS — I480 Paroxysmal atrial fibrillation: Secondary | ICD-10-CM | POA: Diagnosis not present

## 2023-08-19 DIAGNOSIS — I110 Hypertensive heart disease with heart failure: Secondary | ICD-10-CM | POA: Diagnosis not present

## 2023-08-19 DIAGNOSIS — Z7182 Exercise counseling: Secondary | ICD-10-CM | POA: Diagnosis not present

## 2023-08-19 DIAGNOSIS — I5022 Chronic systolic (congestive) heart failure: Secondary | ICD-10-CM | POA: Diagnosis not present

## 2023-08-19 DIAGNOSIS — Z6828 Body mass index (BMI) 28.0-28.9, adult: Secondary | ICD-10-CM | POA: Diagnosis not present

## 2023-08-19 DIAGNOSIS — R7301 Impaired fasting glucose: Secondary | ICD-10-CM | POA: Diagnosis not present

## 2023-08-19 DIAGNOSIS — R600 Localized edema: Secondary | ICD-10-CM | POA: Diagnosis not present

## 2023-08-19 DIAGNOSIS — E782 Mixed hyperlipidemia: Secondary | ICD-10-CM | POA: Diagnosis not present

## 2023-08-19 DIAGNOSIS — Z23 Encounter for immunization: Secondary | ICD-10-CM | POA: Diagnosis not present

## 2023-08-19 DIAGNOSIS — Z713 Dietary counseling and surveillance: Secondary | ICD-10-CM | POA: Diagnosis not present

## 2023-08-19 DIAGNOSIS — E663 Overweight: Secondary | ICD-10-CM | POA: Diagnosis not present

## 2023-08-19 DIAGNOSIS — I10 Essential (primary) hypertension: Secondary | ICD-10-CM | POA: Diagnosis not present

## 2023-09-13 DIAGNOSIS — Z85820 Personal history of malignant melanoma of skin: Secondary | ICD-10-CM | POA: Diagnosis not present

## 2023-09-13 DIAGNOSIS — L570 Actinic keratosis: Secondary | ICD-10-CM | POA: Diagnosis not present

## 2023-09-13 DIAGNOSIS — Z1283 Encounter for screening for malignant neoplasm of skin: Secondary | ICD-10-CM | POA: Diagnosis not present

## 2023-09-13 DIAGNOSIS — X32XXXD Exposure to sunlight, subsequent encounter: Secondary | ICD-10-CM | POA: Diagnosis not present

## 2023-09-13 DIAGNOSIS — D225 Melanocytic nevi of trunk: Secondary | ICD-10-CM | POA: Diagnosis not present

## 2023-09-13 DIAGNOSIS — Z08 Encounter for follow-up examination after completed treatment for malignant neoplasm: Secondary | ICD-10-CM | POA: Diagnosis not present

## 2023-10-16 NOTE — Progress Notes (Unsigned)
 Cardiology Office Note   Date:  10/18/2023   ID:  [name], DOB 12-15-1950, MRN [id number]  PCP:  Benita Stabile, MD  Cardiologist:   Dietrich Pates, MD   Pt presents for f/u of atrial fibrillaton and CHF     History of Present Illness: Evan Ayala is a 73 y.o. male with a history ofHTN and atrial fibrillation  Follows with Z Margo Aye 2021  Complained of SOB and fatigue   Found to be in afib     Echo     He is followed by Hughie Closs.  He complained of SOB and fatigue  Found to be in atrial fibrillaiton I saw him in clinic   Recomm an echo and probably cardioverion   Echo sowed LVEF down at 30 to 35%  RVEF normal   Felt possbly tachy induced   Plan for cardioversion .  This was done  2021  PT back in atrial fibrillation  Underwent ablation in Dec 2021.Elberta Fortis) March 2022  Echo LVEF 55 to 60%       I last saw the pt in 2021   He was last seen in clinic by B Ollis in Jan 2025  Since seen he says he has rare afib episode   He denies CP   Says his breathing is OK    Walks about 2 to 2.5 miles / day    Diet: Breakfast:  Skips Lunch   [employer]:  Barrister's clerk or crystal light  Snacks ;   Crackers or cheese  Current Meds  Medication Sig   Acetaminophen 500 MG capsule Take 1,000 mg by mouth every 8 (eight) hours.   AREXVY 120 MCG/0.5ML injection Inject 0.5 mLs into the muscle once.   clindamycin (CLEOCIN) 300 MG capsule Take 300 mg by mouth as needed. For dental procedures   ELIQUIS 5 MG TABS tablet Take 5 mg by mouth 2 (two) times daily.   ezetimibe (ZETIA) 10 MG tablet Take 1 tablet (10 mg total) by mouth daily.   furosemide (LASIX) 40 MG tablet Take 40 mg by mouth daily.   metoprolol succinate (TOPROL-XL) 50 MG 24 hr tablet Take 1 tablet by mouth once in the morning and at bedtime. Take with or immediately following a meal.   potassium chloride (KLOR-CON) 10 MEQ tablet Take 10 mEq by mouth daily.   rosuvastatin (CRESTOR) 10 MG tablet Take 20 mg by mouth  at bedtime.     Allergies:   Penicillins   Past Medical History:  Diagnosis Date   Cancer ([facility])    Dysrhythmia    AFib   Hypercholesteremia    Persistent atrial fibrillation ([facility]) 07/16/2020   Secondary hypercoagulable state ([facility]) 07/16/2020       Social History:  The patient  reports that he has never smoked. He has never used smokeless tobacco. He reports current alcohol use of about 7.0 standard drinks of alcohol per week. He reports that he does not use drugs.   Family History:  The patient's family history includes COPD in his father.    ROS:  Please see the history of present illness. All other systems are reviewed and  Negative to the above problem except as noted.    PHYSICAL EXAM: VS:  BP (!) 148/92   Pulse 71   Ht 6\' 1"  (1.854 m)   Wt 101.6 kg   SpO2 97%   BMI 29.55 kg/m   BP 156/98 on my check  GEN: overweight 73 yo  in no acute distress  HEENT: normal  Neck: no JVD,    Cardiac:  Irreg irrreg  No signif murmurs   No LE  edema  Respiratory:  clear to auscultation GI: soft, nontender,  No hepatomegaly  MS: no deformity Moving all extremities    EKG:  EKG is not ordered   Cardiac CT   Nov 2021   1. There is normal pulmonary vein drainage into the left atrium.   2. The left atrial appendage is large with chicken wing morphology, one lobe with ostial size 34 x 29 mm and length 61 mm. There is a filling defect on the first pass imaging that resolves on delayed imaging consistent with no thrombus.   3. The esophagus runs to the left from the left atrial midline and is in the proximity to the ostia of the LUPV and LLPV.   Lipid Panel No results found for: "CHOL", "TRIG", "HDL", "CHOLHDL", "VLDL", "LDLCALC", "LDLDIRECT"    Wt Readings from Last 3 Encounters:  10/18/23 101.6 kg  08/05/23 100.1 kg  02/02/23 97.1 kg      ASSESSMENT AND PLAN:  1  Atrial fibrillation  Pt s/p afib ablation in 2021   SInce then rare episodes   Follow  2  Hx HFrEF    Tachy induced   LVEF normalized in echo done in March 2022    Follow  Volume status is good   3  CAD   Seen on CT scan  in 2021  Ca score was 134.  Rx risk factors   Pt denies pain    4 HTN  BP is high  156/98 on my check   Pt will come back for BP check   Bring in readings with him  5  HL     LDI 82  HDL 49  Trig 101    in Jan   WOuld add Zetia to regimen  Check lipomed and liver panel in 8 wks   6  Metabolics   A1C 6.1 in Jan    Reviewed diet    Cut out carbs and processed foods     Time restricted eating       Follow up later this year   Current medicines are reviewed at length with the patient today.  The patient does not have concerns regarding medicines.  Signed, Dietrich Pates, MD  10/18/2023 9:44 PM    [facility] [address] [place], [place], [place]  [address] Phone: [phone]; Fax: [phone]

## 2023-10-18 ENCOUNTER — Ambulatory Visit: Payer: Medicare Other | Attending: Internal Medicine | Admitting: Internal Medicine

## 2023-10-18 ENCOUNTER — Encounter: Payer: Self-pay | Admitting: Internal Medicine

## 2023-10-18 ENCOUNTER — Other Ambulatory Visit: Payer: Self-pay

## 2023-10-18 VITALS — BP 148/92 | HR 71 | Ht 73.0 in | Wt 224.0 lb

## 2023-10-18 DIAGNOSIS — I4891 Unspecified atrial fibrillation: Secondary | ICD-10-CM | POA: Diagnosis not present

## 2023-10-18 DIAGNOSIS — D6869 Other thrombophilia: Secondary | ICD-10-CM

## 2023-10-18 DIAGNOSIS — I10 Essential (primary) hypertension: Secondary | ICD-10-CM | POA: Insufficient documentation

## 2023-10-18 DIAGNOSIS — I428 Other cardiomyopathies: Secondary | ICD-10-CM

## 2023-10-18 DIAGNOSIS — Z131 Encounter for screening for diabetes mellitus: Secondary | ICD-10-CM

## 2023-10-18 DIAGNOSIS — I4819 Other persistent atrial fibrillation: Secondary | ICD-10-CM | POA: Insufficient documentation

## 2023-10-18 MED ORDER — EZETIMIBE 10 MG PO TABS: 10.0000 mg | ORAL_TABLET | Freq: Every day | ORAL | 3 refills | Status: AC

## 2023-10-18 NOTE — Patient Instructions (Signed)
 Medication Instructions:  Start Zetia 10 mg a day  *If you need a refill on your cardiac medications before your next appointment, please call your pharmacy*  Lab Work: NMR, APO B, LIPO A, CMET, HGBA1C IN 8 WEEKS AT ANY [facility]  If you have labs (blood work) drawn today and your tests are completely normal, you will receive your results only by: MyChart Message (if you have MyChart) OR A paper copy in the mail If you have any lab test that is abnormal or we need to change your treatment, we will call you to review the results.  Testing/Procedures:   Follow-Up: At [facility], you and your health needs are our priority.  As part of our continuing mission to provide you with exceptional heart care, our providers are all part of one team.  This team includes your primary Cardiologist (physician) and Advanced Practice Providers or APPs (Physician Assistants and Nurse Practitioners) who all work together to provide you with the care you need, when you need it.  Your next appointment:  4 WEEKS WITH NURSE FOR BP CHECK/ BRING CUFF AND LOG WITH YOU    We recommend signing up for the patient portal called "MyChart".  Sign up information is provided on this After Visit Summary.  MyChart is used to connect with patients for Virtual Visits (Telemedicine).  Patients are able to view lab/test results, encounter notes, upcoming appointments, etc.  Non-urgent messages can be sent to your provider as well.   To learn more about what you can do with MyChart, go to [email or web address].   Other Instructions       1st Floor: - Lobby - Registration  - Pharmacy  - Lab - Cafe  2nd Floor: - PV Lab - Diagnostic Testing (echo, CT, nuclear med)  3rd Floor: - Vacant  4th Floor: - TCTS (cardiothoracic surgery) - AFib Clinic - Structural Heart Clinic - Vascular Surgery  - Vascular Ultrasound  5th Floor: - HeartCare Cardiology (general and EP) - Clinical Pharmacy for coumadin,  hypertension, lipid, weight-loss medications, and med management appointments    Valet parking services will be available as well.

## 2023-10-31 DIAGNOSIS — H40013 Open angle with borderline findings, low risk, bilateral: Secondary | ICD-10-CM | POA: Diagnosis not present

## 2023-11-15 ENCOUNTER — Ambulatory Visit: Attending: Cardiology

## 2023-11-15 DIAGNOSIS — I10 Essential (primary) hypertension: Secondary | ICD-10-CM | POA: Diagnosis not present

## 2023-11-15 NOTE — Patient Instructions (Signed)
 Stop by when you receive your new BP machine and we will check against a manual reading.  We prefer Omron brand blood pressure monitors.   I will message Dr.Ross regarding this.

## 2023-11-15 NOTE — Progress Notes (Signed)
 Patient brought home BP machine.  Manual BP in office left arm, 136/78, patients machine 158/98  Right arm manual 128/78, machine 163/96  Patient plans to replace his machine and stop by for re-check with new machine.

## 2023-11-16 NOTE — Progress Notes (Signed)
 Agree with plan for new machine

## 2023-11-17 ENCOUNTER — Ambulatory Visit: Attending: Cardiology | Admitting: *Deleted

## 2023-11-17 VITALS — BP 124/80 | HR 77

## 2023-11-17 DIAGNOSIS — Z0130 Encounter for examination of blood pressure without abnormal findings: Secondary | ICD-10-CM | POA: Insufficient documentation

## 2023-11-17 NOTE — Progress Notes (Signed)
 Pt in office for BP check. On last visit pt BP monitor was reading higher than the nurse reading. Pt brought a new Omron from [place]. On check today new monitor is reading 145/86.

## 2023-11-24 ENCOUNTER — Telehealth: Payer: Self-pay

## 2023-11-24 NOTE — Telephone Encounter (Signed)
 Patient notified

## 2023-11-24 NOTE — Telephone Encounter (Signed)
  Pinnix, Lukisha G, LPN Blood pressure check Dx Referred by Omie Bickers, MD Reason for Visit   Additional Documentation  Vitals: BP 124/80 (BP Location: Right Arm, Cuff Size: Normal)   Pulse 77   SpO2 97%      More Vitals     Patient purchased new BP machine and bought to nurse visit. BP 124/80 on 11/17/23  He questions why he needs to add amlodipine.   I will forward to Dr.Ross

## 2023-11-24 NOTE — Telephone Encounter (Signed)
-----   Message from Nurse Viann Graces sent at 11/24/2023  9:48 AM EDT -----  ----- Message ----- From: Elmyra Haggard, MD Sent: 11/22/2023   5:32 PM EDT To: Alanna Alley, RN  BP is a little higher than it should be I would try low dose amlodipine 2.5 mg   Follow bp Keep on toprol  XL

## 2023-11-24 NOTE — Progress Notes (Signed)
 BP on our cuff is good  20 points lower than home cuff Have him keep up with BP readings at hoome   Keep log Bring log and cuff to next appt    make appt for later this summer if not already made

## 2023-11-24 NOTE — Telephone Encounter (Signed)
 Excellent control.

## 2023-11-28 ENCOUNTER — Telehealth: Payer: Self-pay | Admitting: *Deleted

## 2023-11-28 NOTE — Telephone Encounter (Signed)
-----   Message from Ola Berger sent at 11/24/2023  6:23 PM EDT -----    ----- Message ----- From: Laurann Pollock, MD Sent: 11/21/2023  12:57 PM EDT To: Elmyra Haggard, MD   ----- Message ----- From: Aubry Blase, LPN Sent: 07/19/9145   4:26 PM EDT To: Elmyra Haggard, MD; Laurann Pollock, MD

## 2023-11-28 NOTE — Telephone Encounter (Signed)
 Per Dr. Avanell Bob  BP on our cuff is good  20 points lower than home cuff Have him keep up with BP readings at hoome   Keep log Bring log and cuff to next appt    make appt for later this summer if not already made   The patient's wife has been notified of the result and verbalized understanding.  All questions (if any) were answered. Carole Churches, LPN 1/61/0960 45:40 AM

## 2023-12-05 ENCOUNTER — Other Ambulatory Visit: Payer: Self-pay | Admitting: Cardiology

## 2023-12-06 NOTE — Telephone Encounter (Signed)
 This Pt is one of Dr. Avanell Bob and Dr. Lawana Pray. This Rx seems to be a Cardiac RX that an EP might not prescribe but, Dr. Adell Age name is on this. Should I use Dr. Lawana Pray or Dr. Avanell Bob as the Dr. Please advise.

## 2023-12-19 DIAGNOSIS — E782 Mixed hyperlipidemia: Secondary | ICD-10-CM | POA: Diagnosis not present

## 2023-12-19 DIAGNOSIS — R7301 Impaired fasting glucose: Secondary | ICD-10-CM | POA: Diagnosis not present

## 2023-12-26 DIAGNOSIS — I110 Hypertensive heart disease with heart failure: Secondary | ICD-10-CM | POA: Diagnosis not present

## 2023-12-26 DIAGNOSIS — E663 Overweight: Secondary | ICD-10-CM | POA: Diagnosis not present

## 2023-12-26 DIAGNOSIS — I480 Paroxysmal atrial fibrillation: Secondary | ICD-10-CM | POA: Diagnosis not present

## 2023-12-26 DIAGNOSIS — Z96652 Presence of left artificial knee joint: Secondary | ICD-10-CM | POA: Diagnosis not present

## 2023-12-26 DIAGNOSIS — I10 Essential (primary) hypertension: Secondary | ICD-10-CM | POA: Diagnosis not present

## 2023-12-26 DIAGNOSIS — I5022 Chronic systolic (congestive) heart failure: Secondary | ICD-10-CM | POA: Diagnosis not present

## 2023-12-26 DIAGNOSIS — Z96651 Presence of right artificial knee joint: Secondary | ICD-10-CM | POA: Diagnosis not present

## 2023-12-26 DIAGNOSIS — R600 Localized edema: Secondary | ICD-10-CM | POA: Diagnosis not present

## 2023-12-26 DIAGNOSIS — E782 Mixed hyperlipidemia: Secondary | ICD-10-CM | POA: Diagnosis not present

## 2023-12-26 DIAGNOSIS — R7301 Impaired fasting glucose: Secondary | ICD-10-CM | POA: Diagnosis not present

## 2023-12-26 DIAGNOSIS — Z6829 Body mass index (BMI) 29.0-29.9, adult: Secondary | ICD-10-CM | POA: Diagnosis not present

## 2023-12-26 DIAGNOSIS — Z85828 Personal history of other malignant neoplasm of skin: Secondary | ICD-10-CM | POA: Diagnosis not present

## 2024-01-19 DIAGNOSIS — I4819 Other persistent atrial fibrillation: Secondary | ICD-10-CM | POA: Diagnosis not present

## 2024-01-19 DIAGNOSIS — I428 Other cardiomyopathies: Secondary | ICD-10-CM | POA: Diagnosis not present

## 2024-01-19 DIAGNOSIS — I4891 Unspecified atrial fibrillation: Secondary | ICD-10-CM | POA: Diagnosis not present

## 2024-01-19 DIAGNOSIS — D6869 Other thrombophilia: Secondary | ICD-10-CM | POA: Diagnosis not present

## 2024-01-19 DIAGNOSIS — Z131 Encounter for screening for diabetes mellitus: Secondary | ICD-10-CM | POA: Diagnosis not present

## 2024-01-19 DIAGNOSIS — I10 Essential (primary) hypertension: Secondary | ICD-10-CM | POA: Diagnosis not present

## 2024-01-23 ENCOUNTER — Ambulatory Visit: Payer: Self-pay | Admitting: Internal Medicine

## 2024-01-23 DIAGNOSIS — E785 Hyperlipidemia, unspecified: Secondary | ICD-10-CM

## 2024-01-26 LAB — NMR, LIPOPROFILE

## 2024-01-28 LAB — COMPREHENSIVE METABOLIC PANEL WITH GFR
ALT: 29 IU/L (ref 0–44)
AST: 26 IU/L (ref 0–40)
Albumin: 4.8 g/dL (ref 3.8–4.8)
Alkaline Phosphatase: 63 IU/L (ref 44–121)
BUN/Creatinine Ratio: 19 (ref 10–24)
BUN: 14 mg/dL (ref 8–27)
Bilirubin Total: 0.9 mg/dL (ref 0.0–1.2)
CO2: 22 mmol/L (ref 20–29)
Calcium: 9.1 mg/dL (ref 8.6–10.2)
Chloride: 100 mmol/L (ref 96–106)
Creatinine, Ser: 0.74 mg/dL — ABNORMAL LOW (ref 0.76–1.27)
Globulin, Total: 2.1 g/dL (ref 1.5–4.5)
Glucose: 99 mg/dL (ref 70–99)
Potassium: 4.3 mmol/L (ref 3.5–5.2)
Sodium: 139 mmol/L (ref 134–144)
Total Protein: 6.9 g/dL (ref 6.0–8.5)
eGFR: 96 mL/min/1.73 (ref >59)

## 2024-01-28 LAB — HEMOGLOBIN A1C
Est. average glucose Bld gHb Est-mCnc: 126 mg/dL
Hgb A1c MFr Bld: 6 % — ABNORMAL HIGH (ref 4.8–5.6)

## 2024-01-28 LAB — NMR, LIPOPROFILE

## 2024-01-28 LAB — APOLIPOPROTEIN B: Apolipoprotein B: 70 mg/dL (ref <90)

## 2024-01-28 LAB — LIPOPROTEIN A (LPA): Lipoprotein (a): 13.6 nmol/L (ref <75.0)

## 2024-02-01 NOTE — Progress Notes (Unsigned)
 Cardiology Office Note   Date:  02/02/2024   ID:  PARKS CZAJKOWSKI, DOB 20-Oct-1950, MRN [id number]  PCP:  Shona Norleen PEDLAR, MD  Cardiologist:   Vina Gull, MD   Pt presents for f/u of atrial fibrillaton and CHF     History of Present Illness: Evan Ayala is a 73 y.o. male with a history ofHTN and atrial fibrillation  Follows with [facility] 2021  Complained of SOB and fatigue   Found to be in afib     Echo showed LVEF down 30 to 35%  RVEF normal   Felt possbly tachy induced   Plan for cardioversion .  This was done  2021  PT back in atrial fibrillation  He underwent ablation in Dec 2021.Ritta) March 2022  Echo LVEF 55 to 60%       I saw the pt in clinic in April 2025  His BP was elevatd at that visit    I asked him to come back later this summer  Since seen he has done well   BP at home ranges from 120s to 142  Average around 128/    He is very active   Gardens  No dizziness  No palpitations.  No CP No SOB  Current Meds  Medication Sig   Acetaminophen  500 MG capsule Take 1,000 mg by mouth every 8 (eight) hours.   AREXVY 120 MCG/0.5ML injection Inject 0.5 mLs into the muscle once.   clindamycin (CLEOCIN) 300 MG capsule Take 300 mg by mouth as needed. For dental procedures   ELIQUIS 5 MG TABS tablet Take 5 mg by mouth 2 (two) times daily.   ezetimibe  (ZETIA ) 10 MG tablet Take 1 tablet (10 mg total) by mouth daily.   furosemide (LASIX) 40 MG tablet Take 40 mg by mouth daily.   metoprolol  succinate (TOPROL -XL) 50 MG 24 hr tablet TAKE 1 TABLET ONCE IN THE MORNING AND AT BEDTIME. TAKE WITH OR IMMEDIATELY FOLLOWING MEALS   potassium chloride (KLOR-CON) 10 MEQ tablet Take 10 mEq by mouth daily.   rosuvastatin (CRESTOR) 10 MG tablet Take 20 mg by mouth at bedtime.     Allergies:   Penicillins   Past Medical History:  Diagnosis Date   Cancer (HCC)    Dysrhythmia    AFib   Hypercholesteremia    Persistent atrial fibrillation (HCC) 07/16/2020   Secondary hypercoagulable state (HCC)  07/16/2020       Social History:  The patient  reports that he has never smoked. He has never used smokeless tobacco. He reports current alcohol use of about 7.0 standard drinks of alcohol per week. He reports that he does not use drugs.   Family History:  The patient's family history includes COPD in his father.    ROS:  Please see the history of present illness. All other systems are reviewed and  Negative to the above problem except as noted.    PHYSICAL EXAM: VS:  BP 132/88 (BP Location: Left Arm, Patient Position: Sitting)   Pulse 79   Ht 6' 1 (1.854 m)   Wt 219 lb 6.4 oz (99.5 kg)   SpO2 93%   BMI 28.95 kg/m     GEN: overweight 73 yo  in no acute distress  HEENT: normal  Neck: no JVD,    Cardiac:  Irreg irrreg  No murmurs   No LE  edema  Respiratory:  clear to auscultation GI: soft, nontender,  No hepatomegaly  MS: no deformity Moving all  extremities    EKG:  EKG is not ordered   Cardiac CT   Nov 2021   1. There is normal pulmonary vein drainage into the left atrium.   2. The left atrial appendage is large with chicken wing morphology, one lobe with ostial size 34 x 29 mm and length 61 mm. There is a filling defect on the first pass imaging that resolves on delayed imaging consistent with no thrombus.   3. The esophagus runs to the left from the left atrial midline and is in the proximity to the ostia of the LUPV and LLPV.   Lipid Panel No results found for: CHOL, TRIG, HDL, CHOLHDL, VLDL, LDLCALC, LDLDIRECT    Wt Readings from Last 3 Encounters:  02/02/24 219 lb 6.4 oz (99.5 kg)  10/18/23 224 lb (101.6 kg)  08/05/23 220 lb 9.6 oz (100.1 kg)      ASSESSMENT AND PLAN:  1  HTN  BP overall appears controlled at home   He spends a lot of time outdoors in garden   Will keep on current regimen  I dont want to risk hypotension, esp since he is on ELiquis   2  Atrial fibrillation  Pt s/p afib ablation in 2021   SInce then he has had rare  episodes   Follow  Keep on Eliquis   3 Hx HFrEF   Tachy induced   LVEF normalized in echo done in March 2022     4 CAD   Seen on CT scan  in 2021  Ca score was 134.  Pt denies pain    5  HL     Currently on Zetia  and Crestor 20 mg  LDL 78   HDL60  Trig 104   COntinue  6  Metabolics   A1C 6.0 in July 2025  Reviewed diet       Follow up in March /April 2026  Current medicines are reviewed at length with the patient today.  The patient does not have concerns regarding medicines.  Signed, Vina Gull, MD  02/02/2024 2:02 PM    [facility] [address] [place], [place], [place]  [address] Phone: [phone]; Fax: [phone]

## 2024-02-02 ENCOUNTER — Encounter: Payer: Self-pay | Admitting: Internal Medicine

## 2024-02-02 ENCOUNTER — Ambulatory Visit: Attending: Internal Medicine | Admitting: Internal Medicine

## 2024-02-02 VITALS — BP 132/88 | HR 79 | Ht 73.0 in | Wt 219.4 lb

## 2024-02-02 DIAGNOSIS — I480 Paroxysmal atrial fibrillation: Secondary | ICD-10-CM | POA: Diagnosis not present

## 2024-02-02 NOTE — Patient Instructions (Signed)
 Medication Instructions:   Continue all current medications.     Labwork:  none  Testing/Procedures:  none  Follow-Up:  March / April 2026   Any Other Special Instructions Will Be Listed Below (If Applicable).   If you need a refill on your cardiac medications before your next appointment, please call your pharmacy.

## 2024-02-03 ENCOUNTER — Encounter: Payer: Self-pay | Admitting: Internal Medicine

## 2024-02-10 DIAGNOSIS — E785 Hyperlipidemia, unspecified: Secondary | ICD-10-CM | POA: Diagnosis not present

## 2024-02-13 LAB — NMR, LIPOPROFILE
Cholesterol, Total: 150 mg/dL (ref 100–199)
HDL Particle Number: 41.5 umol/L (ref >=30.5)
HDL-C: 63 mg/dL (ref >39)
LDL Particle Number: 902 nmol/L (ref <1000)
LDL Size: 20.7 nm (ref >20.5)
LDL-C (NIH Calc): 71 mg/dL (ref 0–99)
LP-IR Score: 40 (ref <=45)
Small LDL Particle Number: 350 nmol/L (ref <=527)
Triglycerides: 87 mg/dL (ref 0–149)

## 2024-03-13 DIAGNOSIS — Z1283 Encounter for screening for malignant neoplasm of skin: Secondary | ICD-10-CM | POA: Diagnosis not present

## 2024-03-13 DIAGNOSIS — D225 Melanocytic nevi of trunk: Secondary | ICD-10-CM | POA: Diagnosis not present

## 2024-03-13 DIAGNOSIS — S90861A Insect bite (nonvenomous), right foot, initial encounter: Secondary | ICD-10-CM | POA: Diagnosis not present

## 2024-03-13 DIAGNOSIS — Z85820 Personal history of malignant melanoma of skin: Secondary | ICD-10-CM | POA: Diagnosis not present

## 2024-03-13 DIAGNOSIS — Z08 Encounter for follow-up examination after completed treatment for malignant neoplasm: Secondary | ICD-10-CM | POA: Diagnosis not present

## 2024-03-28 DIAGNOSIS — S90861D Insect bite (nonvenomous), right foot, subsequent encounter: Secondary | ICD-10-CM | POA: Diagnosis not present

## 2024-03-28 DIAGNOSIS — X32XXXD Exposure to sunlight, subsequent encounter: Secondary | ICD-10-CM | POA: Diagnosis not present

## 2024-03-28 DIAGNOSIS — L570 Actinic keratosis: Secondary | ICD-10-CM | POA: Diagnosis not present

## 2024-04-18 ENCOUNTER — Other Ambulatory Visit: Payer: Self-pay

## 2024-04-18 ENCOUNTER — Encounter: Payer: Self-pay | Admitting: Allergy

## 2024-04-18 ENCOUNTER — Ambulatory Visit: Payer: Self-pay | Admitting: Allergy

## 2024-04-18 VITALS — BP 132/90 | HR 69 | Temp 98.8°F | Resp 16 | Ht 71.0 in | Wt 228.0 lb

## 2024-04-18 DIAGNOSIS — Z91018 Allergy to other foods: Secondary | ICD-10-CM

## 2024-04-18 DIAGNOSIS — Z880 Allergy status to penicillin: Secondary | ICD-10-CM

## 2024-04-18 DIAGNOSIS — Z713 Dietary counseling and surveillance: Secondary | ICD-10-CM | POA: Diagnosis not present

## 2024-04-18 NOTE — Patient Instructions (Addendum)
 Alpha gal/food: Only need to avoid chinese broccoli - no testing for this. Okay to reintroduce mammalian meat slowly back into your diet. If you notice any issues then let us  know. The bloodwork was most likely overly sensitive in your case and clinically irrelevant and you are tolerating red meat with no issues. See below for alpha-gal information. Avoid tick bites.  For mild symptoms you can take over the counter antihistamines (zyrtec 10mg  to 20mg ) and monitor symptoms closely.  If symptoms worsen or if you have severe symptoms including breathing issues, throat closure, significant swelling, whole body hives, severe diarrhea and vomiting, lightheadedness then seek immediate medical care.  Penicillin allergy: Consider penicillin allergy skin testing and in office drug challenge in the future.  Over 90% of people with history of penicillin allergy which occurred over 10 years ago are found to be non-allergic.  You must be off antihistamines for 3-5 days before. Must be in good health and not ill. No vaccines/injections/antibiotics within the past 7 days. Plan on being in the office for 2-3 hours and must bring in the drug you want to do the oral challenge for - will send in prescription to pick up a few days before. You must call to schedule an appointment and specify it's for a drug challenge.   Follow up as needed.   Alpha-gal and Red Meat Allergy   Overview An allergy to "alpha-gal" refers to having a severe and potentially life-threatening allergy to a carbohydrate molecule called galactose-alpha-1,3-galactose that is found in most mammalian or "red meat". Unlike other food allergies which typically occur within minutes of ingestion, symptoms from eating red meat such as pork, lamb or beef may be delayed, occurring 3-8 hours after eating. Most food allergies are directed against a protein molecule, but alpha-gal is unusual because it is a carbohydrate, and a delay in its absorption may  explain the delay in symptoms.  Helpful website: BikerFestival.is  What are the symptoms of an alpha-gal allergy? As with other food allergies, signs or symptoms of an allergy to alpha-gal may include: Hives and itching  Swelling of your lips, face or eyelids  Shortness of breath, cough or wheezing  Abdominal pain, nausea, diarrhea or vomiting The most severe reaction, anaphylaxis, can present as a combination of several of these symptoms, may include low blood pressure, and is potentially fatal.  Because these symptoms are delayed, you may only wake up with them in the middle of the night after an evening meal.  How is an alpha-gal allergy diagnosed? Diagnosis of this allergy starts with your allergist taking an appropriate history and physical examination. Because the onset is usually quite delayed, it can be hard to associate the symptoms with eating red meat many hours previously. Triggers include any red meat - including beef, pork, lamb or even horse products. It may occur after eating hotdogs and hamburgers. In very rare cases the reaction may extend to milk or dairy proteins and gelatin.  Your allergist may recommend testing that includes skin tests to the relevant animal proteins and blood tests which measure the levels of a specific immunoglobulin E (IgE) antibody, to mammalian meats. An investigational blood test, IgE against alpha-gal itself, may also aid in the diagnosis.  How is an alpha-gal allergy treated? Immediate symptoms such as hives or shortness of breath are treated the same as any other food allergy - in an urgent care setting with anti-histamines, epinephrine and other medications. Prevention long-term involves avoidance of all red meat in  sensitized individuals. You may be advised to carry an epinephrine auto-injector, to be used in case of subsequent accidental exposures and reaction. These measures do not necessarily mean switching to a full vegetarian diet,  since poultry and fish can be consumed and do not cause similar reactions. As with other food allergies, there is the possibility that over time the sensitivity diminishes - although these changes may take many years to become apparent.  How do you become allergic to alpha-gal? Alpha-gal is a molecule carried in the saliva of the [place] tick and other potential arthropods typically after feeding on mammalian blood. People that are bitten by the tick, especially those that are bitten repeatedly, are at risk of becoming sensitized and producing the IgE necessary to then cause allergic reactions. Interestingly, allergic reactions may occur to red meat, to subsequent tick bites, and even to medications that contain alpha-gal. Cetuximab is a cancer medication that contains alpha-gal, and people who have had allergic reactions to this medication (these are typically immediate reactions, because it is infused intravenously) have a higher risk for red meat allergy and are likely to have been bitten by ticks in the past. As might be expected, the incidence of tick bites is much higher in the [place] the traditional habitat for the tick. However, cases are now increasingly reported in the [place] and [place]. And it is a phenomenon that has been observed worldwide, with different ticks responsible for similar cases of red meat allergy in many other countries such as [place], [place] and [place].  The discovery of this peculiar allergy has allowed researchers to correlate tick bites with many cases of anaphylaxis that would previously have been classified as 'idiopathic', or of unknown cause. Also, while it was originally thought that the [employer] tick had to feast on mammalian blood in order to carry the alpha-gal molecule, more recent research has shown that it may carry this molecule and be capable of sensitizing humans independently.  How do you prevent an alpha-gal  allergy? Because this allergy is predominantly tick born, you are more likely at risk if you often go outdoors in wooded areas for activities such as hiking, fishing or hunting. The key strategy is to prevent tick bites. This may include wearing long sleeved shirts or pants, using appropriate insect repellants, and surveying for ticks after spending time outdoors. Any observed ticks should be removed carefully by cleaning the site with rubbing alcohol, then using tweezers to pull the tick's head up carefully from the skin using steady pressure. Clean your hands and the site one more time and make sure not to crush the tick between your fingers.

## 2024-04-18 NOTE — Progress Notes (Signed)
 New Patient Note  RE: Evan Ayala MRN: [id number] DOB: 28-Sep-1950 Date of Office Visit: 04/18/2024  Consult requested by: Shona Norleen PEDLAR, MD Primary care provider: Shona Norleen PEDLAR, MD  Chief Complaint: Establish Care (He went to dermatologist and got lab done that high of alpha gal but he did not have nay symptoms. He had tick bite in May. )  History of Present Illness: I had the pleasure of seeing Evan Ayala for initial evaluation at the [facility] on 04/18/2024. He is a 73 y.o. male, who is referred here by Shona Norleen PEDLAR, MD for the evaluation of alpha-gal allergy.  Discussed the use of AI scribe software for clinical note transcription with the patient, who gave verbal consent to proceed.    He was referred by his dermatologist following a routine skin screening where he reported sore legs and joints, and requested testing for tick-borne illnesses.  He initially experienced soreness in his legs and joints, which he discussed with his dermatologist during a routine skin screening in August. Blood tests were conducted, including testing for alpha-gal. He reports no symptoms such as stomach pain, diarrhea, bloating, rash, or itching after consuming red meat.  He consumes beef and pork occasionally, with no adverse reactions noted. He primarily eats chicken and [place] and has not consumed beef, pork, or lamb in the past two weeks, noting no difference in symptoms during this period of dietary change.  He was previously prescribed Doxycycline for 12 days, which he believes alleviated his joint pain and soreness.   He has a history of atrial fibrillation for which he underwent ablation surgery in 2020. He occasionally experiences brief recurrences of atrial fibrillation, lasting 8-9 hours, approximately every six months. He is on medications for heart conditions, including blood pressure, cholesterol, and blood thinners.  He recalls a past allergic reaction to penicillin  during childhood, though he is unsure of the specific symptoms. He also recounts an episode of hives following a suspected insect or spider bite approximately 20 years ago. Additionally, he experienced an immediate reaction with hoarseness after consuming Chinese broccoli, though he tolerates regular broccoli without issue.  No current symptoms of asthma, inhaler use, or other medication allergies. No seasonal or environmental allergy symptoms, fevers, chills, or changes in appetite or bowel habits.     Assessment and Plan: Suzanne is a 73 y.o. 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 No symptoms post red meat consumption and does not feel better with avoidance x 2 weeks. Reaction to Chinese broccoli in the past - tolerates other broccoli with no issues.  Only need to avoid chinese broccoli - no testing for this. Okay to reintroduce mammalian meat slowly back into your diet. If you notice any issues then let us  know. The bloodwork was most likely overly sensitive in your case and clinically irrelevant as you are tolerating red meat with no issues. See below for alpha-gal information. Avoid tick bites.  Penicillin allergy Reported childhood allergy with uncertain symptoms. Consider penicillin allergy skin testing and in office drug challenge in the future.  Over 90% of people with history of penicillin allergy which occurred over 10 years ago are found to be non-allergic.   Return if symptoms worsen or fail to improve.  No orders of the defined types were placed in this encounter.  Lab Orders  No laboratory test(s) ordered today    Other allergy screening: Asthma: no Rhino  conjunctivitis: no Medication allergy: yes Penicillin - as a child. Hymenoptera allergy: no Urticaria: no Eczema:no History of recurrent infections suggestive of immunodeficency: no  Diagnostics: None.  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   Essential hypertension 08/19/2014   Bradycardia 08/19/2014   Hyperlipidemia 08/19/2014   Past Medical History:  Diagnosis Date   Cancer (HCC)    Dysrhythmia    AFib   Hypercholesteremia    Persistent atrial fibrillation (HCC) 07/16/2020   Secondary hypercoagulable state 07/16/2020   Past Surgical History: Past Surgical History:  Procedure Laterality Date   ATRIAL FIBRILLATION ABLATION N/A 06/18/2020   Procedure: ATRIAL FIBRILLATION ABLATION;  Surgeon: Inocencio Soyla Lunger, MD;  Location: [facility];  Service: Cardiovascular;  Laterality: N/A;   CARDIOVERSION N/A 04/10/2020   Procedure: CARDIOVERSION;  Surgeon: Alvan Dorn FALCON, MD;  Location: [facility];  Service: Endoscopy;  Laterality: N/A;   COLONOSCOPY WITH PROPOFOL  N/A 12/10/2021   Procedure: COLONOSCOPY WITH PROPOFOL ;  Surgeon: Shaaron Lamar HERO, MD;  Location: [facility];  Service: Endoscopy;  Laterality: N/A;  2:15pm   HERNIA REPAIR Left    JOINT REPLACEMENT     TOTAL KNEE ARTHROPLASTY Left    Medication List:  Current Outpatient Medications  Medication Sig Dispense Refill   Acetaminophen  500 MG capsule Take 1,000 mg by mouth every 8 (eight) hours.     ELIQUIS 5 MG TABS tablet Take 5 mg by mouth 2 (two) times daily.     ezetimibe  (ZETIA ) 10 MG tablet Take 1 tablet (10 mg total) by mouth daily. 90 tablet 3   furosemide (LASIX) 40 MG tablet Take 40 mg by mouth daily.     metoprolol  succinate (TOPROL -XL) 50 MG 24 hr tablet TAKE 1 TABLET ONCE IN THE MORNING AND AT BEDTIME. TAKE WITH OR IMMEDIATELY FOLLOWING MEALS 180 tablet 2   potassium chloride (KLOR-CON) 10 MEQ tablet Take 10  mEq by mouth daily.     rosuvastatin (CRESTOR) 10 MG tablet Take 20 mg by mouth at bedtime.     AREXVY 120 MCG/0.5ML injection Inject 0.5 mLs into the muscle once. (Patient not taking: Reported on 04/18/2024)     No current facility-administered medications for this visit.   Allergies: Allergies  Allergen Reactions   Penicillins Other (See Comments)    Unknown childhood reaction.   Social History: Social History   Socioeconomic History   Marital status: Married    Spouse name: Not on file   Number of children: Not on file   Years of education: Not on file   Highest education level: Not on file  Occupational History   Not on file  Tobacco Use   Smoking status: Never   Smokeless tobacco: Never  Vaping Use   Vaping status: Never Used  Substance and Sexual Activity   Alcohol use: Yes    Alcohol/week: 7.0 standard drinks of alcohol    Types: 7 Glasses of wine per week    Comment: socially   Drug use: Never   Sexual activity: Yes  Other Topics Concern   Not on file  Social History Narrative   Not on file   Social Drivers of Health   Financial Resource Strain: Not on file  Food  Insecurity: Not on file  Transportation Needs: Not on file  Physical Activity: Not on file  Stress: Not on file  Social Connections: Not on file   Lives in a house. Smoking: denies Occupation: retired  Landscape architect HistorySurveyor, minerals in the house: no Engineer, civil (consulting) in the family room: no Carpet in the bedroom: yes Heating: gas Cooling: central Pet: yes 2 dogs  Family History: Family History  Problem Relation Age of Onset   COPD Father    Problem                               Relation Asthma                                   no Eczema                                no Food allergy                          no Allergic rhino conjunctivitis     no  Review of Systems  Constitutional:  Negative for appetite change, chills, fever and unexpected weight change.  HENT:  Negative for  congestion and rhinorrhea.   Eyes:  Negative for itching.  Respiratory:  Negative for cough, chest tightness, shortness of breath and wheezing.   Cardiovascular:  Negative for chest pain.  Gastrointestinal:  Negative for abdominal pain.  Genitourinary:  Negative for difficulty urinating.  Skin:  Negative for rash.  Neurological:  Negative for headaches.    Objective: BP (!) 132/90 (BP Location: Left Arm, Patient Position: Sitting, Cuff Size: Normal)   Pulse 69   Temp 98.8 F (37.1 C) (Temporal)   Resp 16   Ht 5' 11 (1.803 m)   Wt 228 lb (103.4 kg)   SpO2 97%   BMI 31.80 kg/m  Body mass index is 31.8 kg/m. Physical Exam Vitals and nursing note reviewed.  Constitutional:      Appearance: Normal appearance. He is well-developed.  HENT:     Head: Normocephalic and atraumatic.     Right Ear: Tympanic membrane and external ear normal.     Left Ear: Tympanic membrane and external ear normal.     Nose: Nose normal.     Mouth/Throat:     Mouth: Mucous membranes are moist.     Pharynx: Oropharynx is clear.  Eyes:     Conjunctiva/sclera: Conjunctivae normal.  Cardiovascular:     Rate and Rhythm: Normal rate and regular rhythm.     Heart sounds: Normal heart sounds. No murmur heard.    No friction rub. No gallop.  Pulmonary:     Effort: Pulmonary effort is normal.     Breath sounds: Normal breath sounds. No wheezing, rhonchi or rales.  Musculoskeletal:     Cervical back: Neck supple.  Skin:    General: Skin is warm.     Findings: No rash.  Neurological:     Mental Status: He is alert and oriented to person, place, and time.  Psychiatric:        Behavior: Behavior normal.    The plan was reviewed with the patient/family, and all questions/concerned were addressed.  It was my pleasure to see Evan Ayala today and participate in his care. Please  feel free to contact me with any questions or concerns.  Sincerely,  Orlan Cramp, DO Allergy & Immunology  [facility]  [place] office: [phone] [facility] office: [phone]

## 2024-04-29 DIAGNOSIS — Z23 Encounter for immunization: Secondary | ICD-10-CM | POA: Diagnosis not present

## 2024-05-02 DIAGNOSIS — H40011 Open angle with borderline findings, low risk, right eye: Secondary | ICD-10-CM | POA: Diagnosis not present

## 2024-06-20 DIAGNOSIS — E782 Mixed hyperlipidemia: Secondary | ICD-10-CM | POA: Diagnosis not present

## 2024-06-20 DIAGNOSIS — R7301 Impaired fasting glucose: Secondary | ICD-10-CM | POA: Diagnosis not present

## 2024-06-21 LAB — LAB REPORT - SCANNED
A1c: 6.2
Albumin, Urine POC: 5.7
Albumin/Creatinine Ratio, Urine, POC: 5
Creatinine, POC: 111.4 mg/dL
EGFR: 99

## 2024-06-26 DIAGNOSIS — R600 Localized edema: Secondary | ICD-10-CM | POA: Diagnosis not present

## 2024-06-26 DIAGNOSIS — I119 Hypertensive heart disease without heart failure: Secondary | ICD-10-CM | POA: Diagnosis not present

## 2024-06-26 DIAGNOSIS — Z0001 Encounter for general adult medical examination with abnormal findings: Secondary | ICD-10-CM | POA: Diagnosis not present

## 2024-06-26 DIAGNOSIS — R221 Localized swelling, mass and lump, neck: Secondary | ICD-10-CM | POA: Diagnosis not present

## 2024-06-26 DIAGNOSIS — I10 Essential (primary) hypertension: Secondary | ICD-10-CM | POA: Diagnosis not present

## 2024-06-26 DIAGNOSIS — E782 Mixed hyperlipidemia: Secondary | ICD-10-CM | POA: Diagnosis not present

## 2024-06-26 DIAGNOSIS — I5022 Chronic systolic (congestive) heart failure: Secondary | ICD-10-CM | POA: Diagnosis not present

## 2024-06-26 DIAGNOSIS — R7301 Impaired fasting glucose: Secondary | ICD-10-CM | POA: Diagnosis not present

## 2024-06-26 DIAGNOSIS — Z6830 Body mass index (BMI) 30.0-30.9, adult: Secondary | ICD-10-CM | POA: Diagnosis not present

## 2024-06-26 DIAGNOSIS — Z0000 Encounter for general adult medical examination without abnormal findings: Secondary | ICD-10-CM | POA: Diagnosis not present

## 2024-06-26 DIAGNOSIS — I480 Paroxysmal atrial fibrillation: Secondary | ICD-10-CM | POA: Diagnosis not present

## 2024-06-26 DIAGNOSIS — E669 Obesity, unspecified: Secondary | ICD-10-CM | POA: Diagnosis not present

## 2024-06-27 ENCOUNTER — Ambulatory Visit: Payer: Self-pay | Admitting: Internal Medicine

## 2024-06-28 ENCOUNTER — Encounter: Payer: Self-pay | Admitting: Internal Medicine

## 2024-06-28 DIAGNOSIS — E785 Hyperlipidemia, unspecified: Secondary | ICD-10-CM

## 2024-06-28 NOTE — Telephone Encounter (Signed)
 Pt returned call - please advise

## 2024-06-28 NOTE — Telephone Encounter (Signed)
 Spoke with pt on the phone. States that he has been taking his Crestor and Zetia  as ordered. Informed patient of Dr Okey recommendation. Referral placed for [facility].

## 2024-08-27 ENCOUNTER — Ambulatory Visit: Admitting: Pharmacist

## 2024-09-27 ENCOUNTER — Ambulatory Visit: Admitting: Pulmonary Disease
